# Patient Record
Sex: Male | Born: 1965 | Race: Black or African American | Hispanic: No | Marital: Single | Smoking: Never smoker
Health system: Southern US, Community
[De-identification: ages and names within clinical notes are randomized; demographics above are authoritative.]

## PROBLEM LIST (undated history)

## (undated) DIAGNOSIS — I2510 Atherosclerotic heart disease of native coronary artery without angina pectoris: Secondary | ICD-10-CM

## (undated) DIAGNOSIS — B20 Human immunodeficiency virus [HIV] disease: Secondary | ICD-10-CM

## (undated) DIAGNOSIS — I059 Rheumatic mitral valve disease, unspecified: Secondary | ICD-10-CM

## (undated) HISTORY — PX: VALVE REPLACEMENT: SUR13

---

## 2015-10-21 ENCOUNTER — Encounter (HOSPITAL_COMMUNITY): Payer: Self-pay | Admitting: Emergency Medicine

## 2015-10-21 ENCOUNTER — Observation Stay (HOSPITAL_COMMUNITY): Payer: BLUE CROSS/BLUE SHIELD

## 2015-10-21 ENCOUNTER — Inpatient Hospital Stay (HOSPITAL_COMMUNITY)
Admission: EM | Admit: 2015-10-21 | Discharge: 2015-10-26 | DRG: 444 | Disposition: A | Discharge status: Home Health | Payer: BLUE CROSS/BLUE SHIELD | Attending: Internal Medicine | Admitting: Internal Medicine

## 2015-10-21 ENCOUNTER — Emergency Department (HOSPITAL_COMMUNITY): Payer: BLUE CROSS/BLUE SHIELD

## 2015-10-21 DIAGNOSIS — K8001 Calculus of gallbladder with acute cholecystitis with obstruction: Secondary | ICD-10-CM | POA: Diagnosis not present

## 2015-10-21 DIAGNOSIS — K828 Other specified diseases of gallbladder: Secondary | ICD-10-CM | POA: Diagnosis not present

## 2015-10-21 DIAGNOSIS — I440 Atrioventricular block, first degree: Secondary | ICD-10-CM | POA: Diagnosis present

## 2015-10-21 DIAGNOSIS — K838 Other specified diseases of biliary tract: Secondary | ICD-10-CM | POA: Diagnosis present

## 2015-10-21 DIAGNOSIS — R109 Unspecified abdominal pain: Secondary | ICD-10-CM | POA: Diagnosis not present

## 2015-10-21 DIAGNOSIS — Z21 Asymptomatic human immunodeficiency virus [HIV] infection status: Secondary | ICD-10-CM | POA: Diagnosis present

## 2015-10-21 DIAGNOSIS — Z952 Presence of prosthetic heart valve: Secondary | ICD-10-CM

## 2015-10-21 DIAGNOSIS — K819 Cholecystitis, unspecified: Secondary | ICD-10-CM

## 2015-10-21 DIAGNOSIS — R1011 Right upper quadrant pain: Secondary | ICD-10-CM | POA: Diagnosis not present

## 2015-10-21 DIAGNOSIS — D696 Thrombocytopenia, unspecified: Secondary | ICD-10-CM | POA: Diagnosis not present

## 2015-10-21 DIAGNOSIS — I2510 Atherosclerotic heart disease of native coronary artery without angina pectoris: Secondary | ICD-10-CM | POA: Diagnosis present

## 2015-10-21 DIAGNOSIS — Z7982 Long term (current) use of aspirin: Secondary | ICD-10-CM

## 2015-10-21 DIAGNOSIS — B20 Human immunodeficiency virus [HIV] disease: Secondary | ICD-10-CM | POA: Diagnosis present

## 2015-10-21 DIAGNOSIS — R740 Nonspecific elevation of levels of transaminase and lactic acid dehydrogenase [LDH]: Secondary | ICD-10-CM

## 2015-10-21 DIAGNOSIS — E871 Hypo-osmolality and hyponatremia: Secondary | ICD-10-CM | POA: Diagnosis present

## 2015-10-21 DIAGNOSIS — Z79899 Other long term (current) drug therapy: Secondary | ICD-10-CM

## 2015-10-21 DIAGNOSIS — Z9889 Other specified postprocedural states: Secondary | ICD-10-CM

## 2015-10-21 DIAGNOSIS — IMO0002 Reserved for concepts with insufficient information to code with codable children: Secondary | ICD-10-CM | POA: Diagnosis present

## 2015-10-21 DIAGNOSIS — D72819 Decreased white blood cell count, unspecified: Secondary | ICD-10-CM | POA: Diagnosis present

## 2015-10-21 HISTORY — DX: Atherosclerotic heart disease of native coronary artery without angina pectoris: I25.10

## 2015-10-21 HISTORY — DX: Rheumatic mitral valve disease, unspecified: I05.9

## 2015-10-21 HISTORY — DX: Human immunodeficiency virus (HIV) disease: B20

## 2015-10-21 LAB — CBC
HEMATOCRIT: 41.3 % (ref 39.0–52.0)
HEMOGLOBIN: 14.5 g/dL (ref 13.0–17.0)
MCH: 30.5 pg (ref 26.0–34.0)
MCHC: 35.1 g/dL (ref 30.0–36.0)
MCV: 86.8 fL (ref 78.0–100.0)
Platelets: 187 10*3/uL (ref 150–400)
RBC: 4.76 MIL/uL (ref 4.22–5.81)
RDW: 12.8 % (ref 11.5–15.5)
WBC: 3.8 10*3/uL — ABNORMAL LOW (ref 4.0–10.5)

## 2015-10-21 LAB — COMPREHENSIVE METABOLIC PANEL
ALBUMIN: 4.1 g/dL (ref 3.5–5.0)
ALT: 83 U/L — ABNORMAL HIGH (ref 17–63)
ANION GAP: 7 (ref 5–15)
AST: 83 U/L — AB (ref 15–41)
Alkaline Phosphatase: 114 U/L (ref 38–126)
BUN: 13 mg/dL (ref 6–20)
CHLORIDE: 103 mmol/L (ref 101–111)
CO2: 24 mmol/L (ref 22–32)
Calcium: 9.6 mg/dL (ref 8.9–10.3)
Creatinine, Ser: 0.99 mg/dL (ref 0.61–1.24)
GFR calc Af Amer: >60 mL/min (ref >60)
GFR calc non Af Amer: >60 mL/min (ref >60)
GLUCOSE: 123 mg/dL — AB (ref 65–99)
POTASSIUM: 3.8 mmol/L (ref 3.5–5.1)
SODIUM: 134 mmol/L — AB (ref 135–145)
Total Bilirubin: 0.8 mg/dL (ref 0.3–1.2)
Total Protein: 8.5 g/dL — ABNORMAL HIGH (ref 6.5–8.1)

## 2015-10-21 LAB — URINALYSIS, ROUTINE W REFLEX MICROSCOPIC
BILIRUBIN URINE: NEGATIVE
GLUCOSE, UA: NEGATIVE mg/dL
HGB URINE DIPSTICK: NEGATIVE
Ketones, ur: 15 mg/dL — AB
Leukocytes, UA: NEGATIVE
NITRITE: NEGATIVE
PH: 6.5 (ref 5.0–8.0)
Protein, ur: NEGATIVE mg/dL
SPECIFIC GRAVITY, URINE: 1.021 (ref 1.005–1.030)

## 2015-10-21 LAB — LACTIC ACID, PLASMA: Lactic Acid, Venous: 1.6 mmol/L (ref 0.5–1.9)

## 2015-10-21 LAB — PROTIME-INR
INR: 1 (ref 0.00–1.49)
Prothrombin Time: 13.4 seconds (ref 11.6–15.2)

## 2015-10-21 LAB — LIPASE, BLOOD: Lipase: 73 U/L — ABNORMAL HIGH (ref 11–51)

## 2015-10-21 LAB — TROPONIN I

## 2015-10-21 MED ORDER — ONDANSETRON HCL 4 MG/2ML IJ SOLN
4.0000 mg | Freq: Once | INTRAMUSCULAR | Status: AC
  Administered 2015-10-21: 4 mg via INTRAVENOUS
  Filled 2015-10-21: qty 2

## 2015-10-21 MED ORDER — ASPIRIN EC 81 MG PO TBEC
81.0000 mg | DELAYED_RELEASE_TABLET | Freq: Every day | ORAL | Status: DC
  Administered 2015-10-23 – 2015-10-26 (×4): 81 mg via ORAL
  Filled 2015-10-21 (×5): qty 1

## 2015-10-21 MED ORDER — EMTRICITABINE-TENOFOVIR AF 200-25 MG PO TABS
1.0000 | ORAL_TABLET | Freq: Every day | ORAL | Status: DC
  Filled 2015-10-21 (×3): qty 1

## 2015-10-21 MED ORDER — SODIUM CHLORIDE 0.9 % IV SOLN
INTRAVENOUS | Status: AC
  Administered 2015-10-21: 1000 mL via INTRAVENOUS

## 2015-10-21 MED ORDER — ONDANSETRON HCL 4 MG/2ML IJ SOLN
4.0000 mg | Freq: Four times a day (QID) | INTRAMUSCULAR | Status: DC | PRN
Start: 2015-10-21 — End: 2015-10-26
  Filled 2015-10-21: qty 2

## 2015-10-21 MED ORDER — HEPARIN SODIUM (PORCINE) 5000 UNIT/ML IJ SOLN
5000.0000 [IU] | Freq: Three times a day (TID) | INTRAMUSCULAR | Status: DC
  Administered 2015-10-22 – 2015-10-25 (×8): 5000 [IU] via SUBCUTANEOUS
  Filled 2015-10-21 (×9): qty 1

## 2015-10-21 MED ORDER — POLYETHYLENE GLYCOL 3350 17 G PO PACK: 17.0000 g | PACK | Freq: Every day | ORAL | Status: DC | PRN

## 2015-10-21 MED ORDER — SODIUM CHLORIDE 0.9 % IV BOLUS (SEPSIS)
1000.0000 mL | Freq: Once | INTRAVENOUS | Status: AC
  Administered 2015-10-21: 1000 mL via INTRAVENOUS

## 2015-10-21 MED ORDER — ONDANSETRON HCL 4 MG PO TABS: 4.0000 mg | ORAL_TABLET | Freq: Four times a day (QID) | ORAL | Status: DC | PRN

## 2015-10-21 MED ORDER — BISACODYL 5 MG PO TBEC: 5.0000 mg | DELAYED_RELEASE_TABLET | Freq: Every day | ORAL | Status: DC | PRN

## 2015-10-21 MED ORDER — HYDROMORPHONE HCL 1 MG/ML IJ SOLN
1.0000 mg | INTRAMUSCULAR | Status: DC | PRN
  Administered 2015-10-21 – 2015-10-23 (×6): 1 mg via INTRAVENOUS
  Filled 2015-10-21 (×6): qty 1

## 2015-10-21 MED ORDER — MORPHINE SULFATE (PF) 4 MG/ML IV SOLN
4.0000 mg | Freq: Once | INTRAVENOUS | Status: AC
Start: 2015-10-21 — End: 2015-10-21
  Administered 2015-10-21: 4 mg via INTRAVENOUS
  Filled 2015-10-21: qty 1

## 2015-10-21 MED ORDER — MORPHINE SULFATE (PF) 4 MG/ML IV SOLN
4.0000 mg | Freq: Once | INTRAVENOUS | Status: AC
  Administered 2015-10-21: 4 mg via INTRAVENOUS
  Filled 2015-10-21: qty 1

## 2015-10-21 MED ORDER — HYDROCODONE-ACETAMINOPHEN 5-325 MG PO TABS
1.0000 | ORAL_TABLET | ORAL | Status: DC | PRN
  Administered 2015-10-23: 2 via ORAL
  Filled 2015-10-21: qty 2

## 2015-10-21 MED ORDER — ACETAMINOPHEN 325 MG PO TABS
650.0000 mg | ORAL_TABLET | Freq: Four times a day (QID) | ORAL | Status: DC | PRN
  Administered 2015-10-22: 650 mg via ORAL
  Filled 2015-10-21 (×2): qty 2

## 2015-10-21 MED ORDER — DARUNAVIR-COBICISTAT 800-150 MG PO TABS
1.0000 | ORAL_TABLET | Freq: Every day | ORAL | Status: DC
  Filled 2015-10-21 (×3): qty 1

## 2015-10-21 MED ORDER — ACETAMINOPHEN 650 MG RE SUPP: 650.0000 mg | Freq: Four times a day (QID) | RECTAL | Status: DC | PRN

## 2015-10-21 NOTE — ED Provider Notes (Signed)
CSN: [id number]     Arrival date & time 10/21/15  1221 History   First MD Initiated Contact with Patient 10/21/15 1542     Chief Complaint  Patient presents with  . Flank Pain  . Back Pain  . Abdominal Pain     (Consider location/radiation/quality/duration/timing/severity/associated sxs/prior Treatment) HPI Nicholas Martin is a 50 y.o. male history of HIV, here for evaluation of sudden onset right upper abdominal pain. Patient reports symptoms have been ongoing for the past one day. He reports severe tenderness to his abdomen with palpation. Discomfort radiates through to his back. He reports associated nausea and vomiting. Denies any fevers or chills, urinary symptoms, diarrhea or constipation. Nothing seems to make his problem better. No other modifying factors.  Past Medical History  Diagnosis Date  . HIV (human immunodeficiency virus infection) (HCC)   . Coronary artery disease   . Mitral valve disease    Past Surgical History  Procedure Laterality Date  . Valve replacement     Family History  Problem Relation Age of Onset  . Family history unknown: Yes   Social History  Substance Use Topics  . Smoking status: Never Smoker   . Smokeless tobacco: None  . Alcohol Use: None    Review of Systems A 10 point review of systems was completed and was negative except for pertinent positives and negatives as mentioned in the history of present illness     Allergies  Review of patient's allergies indicates no known allergies.  Home Medications   Prior to Admission medications   Medication Sig Start Date End Date Taking? Authorizing Provider  aspirin EC 81 MG tablet Take 81 mg by mouth daily.   Yes Historical Provider, MD  PREZCOBIX 800-150 MG tablet Take 1 tablet by mouth daily. 10/01/15  Yes Historical Provider, MD  TRUVADA 200-300 MG tablet Take 1 tablet by mouth daily. 10/01/15  Yes Historical Provider, MD   BP 146/75 mmHg  Pulse 61  Temp(Src) 97.2 F (36.2 C) (Oral)   Resp 18  Ht  (1.651 m)  Wt 68.04 kg  BMI 24.96 kg/m2  SpO2 100% Physical Exam  Constitutional: He is oriented to person, place, and time. He appears well-developed and well-nourished.  HENT:  Head: Normocephalic and atraumatic.  Mouth/Throat: Oropharynx is clear and moist.  Eyes: Conjunctivae are normal. Pupils are equal, round, and reactive to light. Right eye exhibits no discharge. Left eye exhibits no discharge. No scleral icterus.  Neck: Neck supple.  Cardiovascular: Normal rate, regular rhythm and normal heart sounds.   Pulmonary/Chest: Effort normal and breath sounds normal. No respiratory distress. He has no wheezes. He has no rales.  Abdominal: Soft.  Patient guards throughout abdominal exam. Tenderness right upper quadrant.  Musculoskeletal: He exhibits no tenderness.  Neurological: He is alert and oriented to person, place, and time.  Cranial Nerves II-XII grossly intact  Skin: Skin is warm and dry. No rash noted.  Psychiatric: He has a normal mood and affect.  Nursing note and vitals reviewed.   ED Course  Procedures (including critical care time) Labs Review Labs Reviewed  LIPASE, BLOOD - Abnormal; Notable for the following:    Lipase 73 (*)    All other components within normal limits  COMPREHENSIVE METABOLIC PANEL - Abnormal; Notable for the following:    Sodium 134 (*)    Glucose, Bld 123 (*)    Total Protein 8.5 (*)    AST 83 (*)    ALT 83 (*)  All other components within normal limits  CBC - Abnormal; Notable for the following:    WBC 3.8 (*)    All other components within normal limits  URINALYSIS, ROUTINE W REFLEX MICROSCOPIC (NOT AT [facility]) - Abnormal; Notable for the following:    Ketones, ur 15 (*)    All other components within normal limits  PROTIME-INR  COMPREHENSIVE METABOLIC PANEL  CBC  LACTIC ACID, PLASMA  LACTIC ACID, PLASMA  T-HELPER CELLS (CD4) COUNT (NOT AT [facility])  HIV 1 RNA QUANT-NO REFLEX-BLD    Imaging Review [place] Abdomen  Limited  10/21/2015  CLINICAL DATA:  Right upper quadrant pain for 1 day EXAM: US ABDOMEN LIMITED - RIGHT UPPER QUADRANT COMPARISON:  None. FINDINGS: Gallbladder: The gallbladder is mildly distended without wall thickening. There is tumefactive sludge within the gallbladder. There are no shadowing echogenic foci which move as is expected with gallstones. No pericholecystic fluid is evident. No sonographic Murphy sign noted by sonographer. Common bile duct: Diameter: 9 mm, mildly dilated. No mass or calculus is evident in the biliary ductal system. Liver: No focal lesion identified. Within normal limits in parenchymal echogenicity. IMPRESSION: Gallbladder is distended with tumefactive sludge within the gallbladder. Gallbladder otherwise appears unremarkable. There is prominence of the common bile duct at 9 mm without demonstrable mass or calculus in the biliary ductal system appreciable by ultrasound. Given these findings, it may be reasonable to correlate with nuclear medicine hepatobiliary imaging study to assess for cystic duct patency. Electronically Signed   By: Bretta BangWilliam  Woodruff III M.D.   On: 10/21/2015 16:48   I have personally reviewed and evaluated these images and lab results as part of my medical decision-making.   EKG Interpretation None     ED ECG REPORT   Date: 10/21/2015  Rate: 73  Rhythm: normal sinus rhythm  QRS Axis: right  Intervals: PR prolonged  ST/T Wave abnormalities: normal  Conduction Disutrbances:first-degree A-V block   Narrative Interpretation:   Old EKG Reviewed: none available  I have personally reviewed the EKG tracing and agree with the computerized printout as noted.  MDM  Patient with history of HIV, followed by [place]. in [place], here for evaluation of right upper quadrant pain 1 day. Patient has tenderness in right upper quadrant on exam. He has associated nausea and vomiting. On arrival he is hemodynamically stable and afebrile. Screening labs significant  for elevation of AST and ALT at 83, lipase 73. Ultrasound shows gallbladder is distended with tumefactive sludge.  Discussed with hospitalist, Dr. Antionette Charpyd. Patient to be admitted for possible HIDA scan in the morning. Final diagnoses:  RUQ pain        Joycie PeekBenjamin Satonya Lux, PA-C 10/21/15 1914   Loren Raceravid Yelverton, MD 11/06/15 2312

## 2015-10-21 NOTE — H&P (Signed)
History and Physical    Nicholas Martin ZOX:[id number] DOB: March 26, 1966 DOA: 10/21/2015  PCP: No primary care provider on file.   Patient coming from: Home   Chief Complaint: RUQ abdominal pain   HPI: Nicholas Martin is a 50 y.o. male with medical history significant for HIV, CAD, and mitral valve repair who presents to the emergency department for evaluation of severe right upper quadrant pain that began yesterday. Patient is here from [place] and we are in the process of obtaining medical records. History is difficult to obtain as the patient is writhing in pain, stating that he is unable to talk due to the pain, asked for a pen and paper, but does not write anything legibly. He is, however, able to provide some yes or no answers. He was apparently in his usual state until the development of acute right upper quadrant pain sometime yesterday that he reports is worse with oral intake. He denies experiencing similar symptoms previously, reports chills, but denies subjective fevers. He denies dysuria, nausea, vomiting, or diarrhea. He denies chest pain, palpitations, dyspnea, or cough. Aside from the abdominal pain, he reports no other complaints.   ED Course: Upon arrival to the ED, patient is found to be afebrile, saturating well on room air, and with vital signs stable. EKG demonstrates a sinus rhythm with first-degree AV block, right axis deviation, and LVH by voltage criteria. Chemistry panel is notable for mild hyponatremia and mild elevations in the transaminases. CBC features a leukopenia with WBC 3800 and other indices normal. Lipase is mildly elevated to 73 and a urinalysis is notable for 15 ketones only. Ultrasound of the abdomen was obtained and notable for gallbladder distention with active sludge but otherwise normal appearance to the gallbladder. Also noted on abdominal ultrasound is dilatation of the common bile duct 9 mm without mass or obstructing calculus identified on the study. Patient  was given a 1 L normal saline bolus in the emergency department and provided symptomatic care with multiple doses of IV Zofran and morphine. Patient remained hemodynamically stable and in no respiratory distress, but continued to experience intractable abdominal pain and will be observed on the medical surgical unit for ongoing evaluation and management of this, with plans for HIDA scan and additional laboratory evaluation while attempting to bring pain under better control.  Review of Systems:  All other systems reviewed and apart from HPI, are negative.  Past Medical History  Diagnosis Date  . HIV (human immunodeficiency virus infection) (HCC)   . Coronary artery disease   . Mitral valve disease     Past Surgical History  Procedure Laterality Date  . Valve replacement       reports that he has never smoked. He does not have any smokeless tobacco history on file. His alcohol and drug histories are not on file.  No Known Allergies  Family History  Problem Relation Age of Onset  . Family history unknown: Yes     Prior to Admission medications   Medication Sig Start Date End Date Taking? Authorizing Provider  aspirin EC 81 MG tablet Take 81 mg by mouth daily.   Yes Historical Provider, MD  PREZCOBIX 800-150 MG tablet Take 1 tablet by mouth daily. 10/01/15  Yes Historical Provider, MD  TRUVADA 200-300 MG tablet Take 1 tablet by mouth daily. 10/01/15  Yes Historical Provider, MD    Physical Exam: Filed Vitals:   10/21/15 1545 10/21/15 1546 10/21/15 1600 10/21/15 1615  BP: 138/80 138/80 144/79 146/75  Pulse:  58 63 61  Temp:  97.2 F (36.2 C)    TempSrc:  Oral    Resp:      Height:   (1.651 m)    Weight:      SpO2:  100% 100% 100%      Constitutional: NO apparent respiratory distress, In apparent discomfort, writhing in pain Eyes: PERTLA, lids and conjunctivae normal ENMT: Mucous membranes are moist. Posterior pharynx clear of any exudate or lesions.   Neck: normal,  supple, no masses, no thyromegaly Respiratory: clear to auscultation bilaterally, no wheezing, no crackles. Normal respiratory effort.   Cardiovascular: S1 & S2 heard, regular rate and rhythm, no significant murmurs. Trace edema to b/l LE's. 2+ pedal pulses.   Abdomen: No distension, exquisitely tender in RUQ, no rebound pain or guarding. Bowel sounds active.  Musculoskeletal: no clubbing / cyanosis. No joint deformity upper and lower extremities. Normal muscle tone.  Skin: no significant rashes, lesions, ulcers. Warm, dry, well-perfused. Neurologic: CN 2-12 grossly intact. Sensation intact, DTR normal. Strength 5/5 in all 4 limbs.  Psychiatric: Difficult to assess d/t intractable pain; behavior somewhat bizarre, claiming can't speak d/t pain, asking to write his answers to questioning, but then goes on to verbally explain what he was trying to write.     Labs on Admission: I have personally reviewed following labs and imaging studies  CBC:  Recent Labs Lab 10/21/15 1241  WBC 3.8*  HGB 14.5  HCT 41.3  MCV 86.8  PLT 187   Basic Metabolic Panel:  Recent Labs Lab 10/21/15 1241  NA 134*  K 3.8  CL 103  CO2 24  GLUCOSE 123*  BUN 13  CREATININE 0.99  CALCIUM 9.6   GFR: Estimated Creatinine Clearance: 79.4 mL/min (by C-G formula based on Cr of 0.99). Liver Function Tests:  Recent Labs Lab 10/21/15 1241  AST 83*  ALT 83*  ALKPHOS 114  BILITOT 0.8  PROT 8.5*  ALBUMIN 4.1    Recent Labs Lab 10/21/15 1241  LIPASE 73*   No results for input(s): AMMONIA in the last 168 hours. Coagulation Profile: No results for input(s): INR, PROTIME in the last 168 hours. Cardiac Enzymes: No results for input(s): CKTOTAL, CKMB, CKMBINDEX, TROPONINI in the last 168 hours. BNP (last 3 results) No results for input(s): PROBNP in the last 8760 hours. HbA1C: No results for input(s): HGBA1C in the last 72 hours. CBG: No results for input(s): GLUCAP in the last 168 hours. Lipid  Profile: No results for input(s): CHOL, HDL, LDLCALC, TRIG, CHOLHDL, LDLDIRECT in the last 72 hours. Thyroid Function Tests: No results for input(s): TSH, T4TOTAL, FREET4, T3FREE, THYROIDAB in the last 72 hours. Anemia Panel: No results for input(s): VITAMINB12, FOLATE, FERRITIN, TIBC, IRON, RETICCTPCT in the last 72 hours. Urine analysis:    Component Value Date/Time   COLORURINE YELLOW 10/21/2015 1754   APPEARANCEUR CLEAR 10/21/2015 1754   LABSPEC 1.021 10/21/2015 1754   PHURINE 6.5 10/21/2015 1754   GLUCOSEU NEGATIVE 10/21/2015 1754   HGBUR NEGATIVE 10/21/2015 1754   BILIRUBINUR NEGATIVE 10/21/2015 1754   KETONESUR 15* 10/21/2015 1754   PROTEINUR NEGATIVE 10/21/2015 1754   NITRITE NEGATIVE 10/21/2015 1754   LEUKOCYTESUR NEGATIVE 10/21/2015 1754   Sepsis Labs: (procalcitonin:4,lacticidven:4) )No results found for this or any previous visit (from the past 240 hour(s)).   Radiological Exams on Admission: US Abdomen Limited  10/21/2015  CLINICAL DATA:  Right upper quadrant pain for 1 day EXAM: US ABDOMEN LIMITED - RIGHT UPPER QUADRANT COMPARISON:  None. FINDINGS: Gallbladder: The gallbladder is mildly distended without wall thickening. There is tumefactive sludge within the gallbladder. There are no shadowing echogenic foci which move as is expected with gallstones. No pericholecystic fluid is evident. No sonographic Murphy sign noted by sonographer. Common bile duct: Diameter: 9 mm, mildly dilated. No mass or calculus is evident in the biliary ductal system. Liver: No focal lesion identified. Within normal limits in parenchymal echogenicity. IMPRESSION: Gallbladder is distended with tumefactive sludge within the gallbladder. Gallbladder otherwise appears unremarkable. There is prominence of the common bile duct at 9 mm without demonstrable mass or calculus in the biliary ductal system appreciable by ultrasound. Given these findings, it may be reasonable to correlate with nuclear  medicine hepatobiliary imaging study to assess for cystic duct patency. Electronically Signed   By: Bretta BangWilliam  Woodruff III M.D.   On: 10/21/2015 16:48    EKG: Independently reviewed. Sinus, first degree AV block, RAD, LVH by voltage criteria   Assessment/Plan  1. 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 rather than infection, will culture if febrile  - With reported hx of CAD, will check a troponin; RLL PNA unlikely, will check CXR  - Plan for further eval with HIDA scan  - Suspect there is an element of embellishment as abd exam is somewhat inconsistent and pt seems to start moaning and writhing when aware of observer; this is not to say that there is no actual acute pathology, which I believe there is  - Given degree of apparent pain, considered further imaging with CT abd/pel, but stable vitals and relatively benign labs argue against intrabdominal catastrophe; plan to check lactate, and if high, obtain CT abd, otherwise will provide supportive care and await HIDA findings  2. Hyponatremia  - Mild, serum sodium 134 on arrival  - Anticipate resolution with IVF  - Repeat CMP in am    3. 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 HIV  - Attempting to obtain records from [place]  - Managed at home with Prezcobix and Truvada  - Will continue Prezcobix here, and replace Truvada with formulary-equivalent Descovy while here  - There is some, albeit low, concern for opportunistic infection or cancer leading to the presentation, will check  CD4    DVT prophylaxis: sq heparin Code Status: Full  Family Communication: Discussed with patient Disposition Plan: Observe on med-surg  Consults called: None Admission status: Observation    Nicholas Deutscherimothy S Coty Larsh, MD [facility] Pager [phone]  If 7PM-7AM, please contact night-coverage [email or web address] Password [email or web address]  10/21/2015, 7:01 PM

## 2015-10-21 NOTE — ED Notes (Signed)
Pt signed release or information for previous medical information.

## 2015-10-21 NOTE — ED Notes (Signed)
Rt flank pain and abd pain since  Yesterday , no n/v/d  Felt sick  Denies dysuria  Hurts to touch it hurts to lay down

## 2015-10-21 NOTE — ED Notes (Signed)
Patient transported to X-ray 

## 2015-10-22 ENCOUNTER — Observation Stay (HOSPITAL_COMMUNITY): Payer: BLUE CROSS/BLUE SHIELD

## 2015-10-22 ENCOUNTER — Encounter (HOSPITAL_COMMUNITY): Payer: Self-pay | Admitting: Radiology

## 2015-10-22 DIAGNOSIS — K838 Other specified diseases of biliary tract: Secondary | ICD-10-CM | POA: Diagnosis not present

## 2015-10-22 DIAGNOSIS — R1013 Epigastric pain: Secondary | ICD-10-CM

## 2015-10-22 DIAGNOSIS — R109 Unspecified abdominal pain: Secondary | ICD-10-CM | POA: Insufficient documentation

## 2015-10-22 DIAGNOSIS — K819 Cholecystitis, unspecified: Secondary | ICD-10-CM | POA: Diagnosis not present

## 2015-10-22 DIAGNOSIS — K828 Other specified diseases of gallbladder: Secondary | ICD-10-CM | POA: Diagnosis not present

## 2015-10-22 LAB — CBC
HCT: 41.4 % (ref 39.0–52.0)
Hemoglobin: 14.1 g/dL (ref 13.0–17.0)
MCH: 29.9 pg (ref 26.0–34.0)
MCHC: 34.1 g/dL (ref 30.0–36.0)
MCV: 87.7 fL (ref 78.0–100.0)
PLATELETS: 153 10*3/uL (ref 150–400)
RBC: 4.72 MIL/uL (ref 4.22–5.81)
RDW: 12.8 % (ref 11.5–15.5)
WBC: 8.6 10*3/uL (ref 4.0–10.5)

## 2015-10-22 LAB — COMPREHENSIVE METABOLIC PANEL
ALBUMIN: 3.4 g/dL — AB (ref 3.5–5.0)
ALT: 61 U/L (ref 17–63)
ANION GAP: 8 (ref 5–15)
AST: 52 U/L — ABNORMAL HIGH (ref 15–41)
Alkaline Phosphatase: 90 U/L (ref 38–126)
BUN: 10 mg/dL (ref 6–20)
CALCIUM: 8.6 mg/dL — AB (ref 8.9–10.3)
CO2: 26 mmol/L (ref 22–32)
CREATININE: 1.06 mg/dL (ref 0.61–1.24)
Chloride: 100 mmol/L — ABNORMAL LOW (ref 101–111)
GFR calc Af Amer: >60 mL/min (ref >60)
GFR calc non Af Amer: >60 mL/min (ref >60)
Glucose, Bld: 103 mg/dL — ABNORMAL HIGH (ref 65–99)
Potassium: 4.2 mmol/L (ref 3.5–5.1)
SODIUM: 134 mmol/L — AB (ref 135–145)
TOTAL PROTEIN: 7.2 g/dL (ref 6.5–8.1)
Total Bilirubin: 1.3 mg/dL — ABNORMAL HIGH (ref 0.3–1.2)

## 2015-10-22 LAB — T-HELPER CELLS (CD4) COUNT (NOT AT ARMC)
CD4 T CELL ABS: 70 /uL — AB (ref 400–2700)
CD4 T CELL HELPER: 8 % — AB (ref 33–55)

## 2015-10-22 LAB — HIV-1 RNA QUANT-NO REFLEX-BLD
HIV 1 RNA Quant: 130 copies/mL
LOG10 HIV-1 RNA: 2.114 {Log_copies}/mL

## 2015-10-22 LAB — RAPID URINE DRUG SCREEN, HOSP PERFORMED
Amphetamines: NOT DETECTED
BARBITURATES: NOT DETECTED
Benzodiazepines: NOT DETECTED
COCAINE: NOT DETECTED
Opiates: POSITIVE — AB
TETRAHYDROCANNABINOL: NOT DETECTED

## 2015-10-22 LAB — LACTIC ACID, PLASMA: LACTIC ACID, VENOUS: 1.2 mmol/L (ref 0.5–1.9)

## 2015-10-22 LAB — GLUCOSE, CAPILLARY: Glucose-Capillary: 100 mg/dL — ABNORMAL HIGH (ref 65–99)

## 2015-10-22 LAB — LIPASE, BLOOD: Lipase: 36 U/L (ref 11–51)

## 2015-10-22 MED ORDER — PIPERACILLIN-TAZOBACTAM 3.375 G IVPB
3.3750 g | Freq: Three times a day (TID) | INTRAVENOUS | Status: DC
  Administered 2015-10-22 – 2015-10-26 (×11): 3.375 g via INTRAVENOUS
  Filled 2015-10-22 (×14): qty 50

## 2015-10-22 MED ORDER — DIATRIZOATE MEGLUMINE & SODIUM 66-10 % PO SOLN
15.0000 mL | ORAL | Status: AC
  Administered 2015-10-22 (×2): 15 mL via ORAL
  Filled 2015-10-22: qty 30

## 2015-10-22 MED ORDER — SODIUM CHLORIDE 0.9 % IV SOLN
INTRAVENOUS | Status: DC
  Administered 2015-10-23: 1000 mL via INTRAVENOUS
  Administered 2015-10-23 – 2015-10-24 (×2): via INTRAVENOUS

## 2015-10-22 MED ORDER — DIATRIZOATE MEGLUMINE & SODIUM 66-10 % PO SOLN
15.0000 mL | ORAL | Status: AC
  Filled 2015-10-22: qty 30

## 2015-10-22 MED ORDER — IOPAMIDOL (ISOVUE-300) INJECTION 61%
INTRAVENOUS | Status: AC
  Administered 2015-10-22: 100 mL
  Filled 2015-10-22: qty 100

## 2015-10-22 MED ORDER — TECHNETIUM TC 99M MEBROFENIN IV KIT
5.2000 | PACK | Freq: Once | INTRAVENOUS | Status: AC | PRN
  Administered 2015-10-22: 5 via INTRAVENOUS

## 2015-10-22 NOTE — Progress Notes (Signed)
Pharmacy Antibiotic Note  Nicholas LinKevin L Martin is a 50 y.o. male admitted on 10/21/2015 with intra-abdominal infection.  Pharmacy has been consulted for zosyn dosing.  Plan: Zosyn 3.375g IV q8h (4 hour infusion).  Monitor fever, WBC, signs of infection  Monitor renal function  Height: 5\' 9"  (175.3 cm) Weight: 150 lb (68.04 kg) IBW/kg (Calculated) : 70.7  Temp (24hrs), Avg:98.5 F (36.9 C), Min:97.2 F (36.2 C), Max:99.8 F (37.7 C)   Recent Labs Lab 10/21/15 1241 10/21/15 2000 10/22/15 0536  WBC 3.8*  --  8.6  CREATININE 0.99  --  1.06  LATICACIDVEN  --  1.6 1.2    Estimated Creatinine Clearance: 82 mL/min (by C-G formula based on Cr of 1.06).    No Known Allergies  Antimicrobials this admission: Zosyn 7/21 >>  Dose adjustments this admission: N/A  Microbiology results: None  Thank you for allowing pharmacy to be a part of this patient's care.  Gwyndolyn KaufmanKai Kang Bernette Redbird(Kenny), PharmD  PGY1 Pharmacy Resident Pager: [phone] 10/22/2015 10:58 AM   I discussed / reviewed the pharmacy note by Dr. Pandora LeiterKang and I agree with the resident's findings and plans as documented. Thank you Okey RegalLisa Alazia Crocket, PharmD [phone]

## 2015-10-22 NOTE — Consult Note (Addendum)
Reason for Consult: RUQ abdominal pain  Referring Physician: Dr. Christia Reading Opyd   Nicholas Martin is an 50 y.o. male with a history of methamphetamine substance misuse, HIV and mitral valve repair. Patient started having constant, 10/10 stabbing and aching RUQ pain since 2:00 am on October 20, 2015. Patient had never experienced the aforementioned symptoms in the past. RUQ pain radiated to his back and right shoulder and was intensified with movement. Patient was unable to drink or eat due nausea and vomiting associated with the pain. Patient experienced chills but unsure about fever. He is currently residing in [place] [employer] for substance misuse treatment at [facility]. Patient's last bowel movement was yesterday at 6:00 am. Passing flatus.Patient is currently anticipating Hida scan.    Past Medical History  Diagnosis Date  . HIV (human immunodeficiency virus infection) ([place])   . Coronary artery disease   . Mitral valve disease     Past Surgical History  Procedure Laterality Date  . Valve replacement      Family History  Problem Relation Age of Onset  . Family history unknown: Yes    Social History:  Denies smoking. Denies alcohol. Originally from [place], [place]. In treatment for methamphetamine misuse.   Allergies: No Known Allergies  Medications:  Prior to Admission medications   Medication Sig Start Date End Date Taking? Authorizing Provider  aspirin EC 81 MG tablet Take 81 mg by mouth daily.   Yes Historical Provider, MD  PREZCOBIX 800-150 MG tablet Take 1 tablet by mouth daily. 10/01/15  Yes Historical Provider, MD  TRUVADA 200-300 MG tablet Take 1 tablet by mouth daily. 10/01/15  Yes Historical Provider, MD     Results for orders placed or performed during the hospital encounter of 10/21/15 (from the past 48 hour(s))  Lipase, blood     Status: Abnormal   Collection Time: 10/21/15 12:41 PM  Result Value Ref Range   Lipase 73 (H) 11 - 51 U/L  Comprehensive metabolic panel      Status: Abnormal   Collection Time: 10/21/15 12:41 PM  Result Value Ref Range   Sodium 134 (L) 135 - 145 mmol/L   Potassium 3.8 3.5 - 5.1 mmol/L   Chloride 103 101 - 111 mmol/L   CO2 24 22 - 32 mmol/L   Glucose, Bld 123 (H) 65 - 99 mg/dL   BUN 13 6 - 20 mg/dL   Creatinine, Ser 0.99 0.61 - 1.24 mg/dL   Calcium 9.6 8.9 - 10.3 mg/dL   Total Protein 8.5 (H) 6.5 - 8.1 g/dL   Albumin 4.1 3.5 - 5.0 g/dL   AST 83 (H) 15 - 41 U/L   ALT 83 (H) 17 - 63 U/L   Alkaline Phosphatase 114 38 - 126 U/L   Total Bilirubin 0.8 0.3 - 1.2 mg/dL   GFR calc non Af Amer >60 >60 mL/min   GFR calc Af Amer >60 >60 mL/min    Comment: (NOTE) The eGFR has been calculated using the CKD EPI equation. This calculation has not been validated in all clinical situations. eGFR's persistently <60 mL/min signify possible Chronic Kidney Disease.    Anion gap 7 5 - 15  CBC     Status: Abnormal   Collection Time: 10/21/15 12:41 PM  Result Value Ref Range   WBC 3.8 (L) 4.0 - 10.5 K/uL   RBC 4.76 4.22 - 5.81 MIL/uL   Hemoglobin 14.5 13.0 - 17.0 g/dL   HCT 41.3 39.0 - 52.0 %   MCV 86.8 78.0 -  100.0 fL   MCH 30.5 26.0 - 34.0 pg   MCHC 35.1 30.0 - 36.0 g/dL   RDW 12.8 11.5 - 15.5 %   Platelets 187 150 - 400 K/uL  Urinalysis, Routine w reflex microscopic     Status: Abnormal   Collection Time: 10/21/15  5:54 PM  Result Value Ref Range   Color, Urine YELLOW YELLOW   APPearance CLEAR CLEAR   Specific Gravity, Urine 1.021 1.005 - 1.030   pH 6.5 5.0 - 8.0   Glucose, UA NEGATIVE NEGATIVE mg/dL   Hgb urine dipstick NEGATIVE NEGATIVE   Bilirubin Urine NEGATIVE NEGATIVE   Ketones, ur 15 (A) NEGATIVE mg/dL   Protein, ur NEGATIVE NEGATIVE mg/dL   Nitrite NEGATIVE NEGATIVE   Leukocytes, UA NEGATIVE NEGATIVE    Comment: MICROSCOPIC NOT DONE ON URINES WITH NEGATIVE PROTEIN, BLOOD, LEUKOCYTES, NITRITE, OR GLUCOSE <1000 mg/dL.  Protime-INR     Status: None   Collection Time: 10/21/15  7:39 PM  Result Value Ref Range    Prothrombin Time 13.4 11.6 - 15.2 seconds   INR 1.00 0.00 - 1.49  T-helper cells (CD4) count (not at [facility])     Status: Abnormal   Collection Time: 10/21/15  7:39 PM  Result Value Ref Range   CD4 T Cell Abs 70 (L) 400 - 2700 /uL   CD4 % Helper T Cell 8 (L) 33 - 55 %    Comment: Performed at [facility]  Troponin I     Status: None   Collection Time: 10/21/15  7:39 PM  Result Value Ref Range   Troponin I <0.03 <0.03 ng/mL  Lactic acid, plasma     Status: None   Collection Time: 10/21/15  8:00 PM  Result Value Ref Range   Lactic Acid, Venous 1.6 0.5 - 1.9 mmol/L  Comprehensive metabolic panel     Status: Abnormal   Collection Time: 10/22/15  5:36 AM  Result Value Ref Range   Sodium 134 (L) 135 - 145 mmol/L   Potassium 4.2 3.5 - 5.1 mmol/L   Chloride 100 (L) 101 - 111 mmol/L   CO2 26 22 - 32 mmol/L   Glucose, Bld 103 (H) 65 - 99 mg/dL   BUN 10 6 - 20 mg/dL   Creatinine, Ser 1.06 0.61 - 1.24 mg/dL   Calcium 8.6 (L) 8.9 - 10.3 mg/dL   Total Protein 7.2 6.5 - 8.1 g/dL   Albumin 3.4 (L) 3.5 - 5.0 g/dL   AST 52 (H) 15 - 41 U/L   ALT 61 17 - 63 U/L   Alkaline Phosphatase 90 38 - 126 U/L   Total Bilirubin 1.3 (H) 0.3 - 1.2 mg/dL   GFR calc non Af Amer >60 >60 mL/min   GFR calc Af Amer >60 >60 mL/min    Comment: (NOTE) The eGFR has been calculated using the CKD EPI equation. This calculation has not been validated in all clinical situations. eGFR's persistently <60 mL/min signify possible Chronic Kidney Disease.    Anion gap 8 5 - 15  CBC     Status: None   Collection Time: 10/22/15  5:36 AM  Result Value Ref Range   WBC 8.6 4.0 - 10.5 K/uL   RBC 4.72 4.22 - 5.81 MIL/uL   Hemoglobin 14.1 13.0 - 17.0 g/dL   HCT 41.4 39.0 - 52.0 %   MCV 87.7 78.0 - 100.0 fL   MCH 29.9 26.0 - 34.0 pg   MCHC 34.1 30.0 - 36.0 g/dL  RDW 12.8 11.5 - 15.5 %   Platelets 153 150 - 400 K/uL  Lactic acid, plasma     Status: None   Collection Time: 10/22/15  5:36 AM  Result Value  Ref Range   Lactic Acid, Venous 1.2 0.5 - 1.9 mmol/L  Glucose, capillary     Status: Abnormal   Collection Time: 10/22/15  7:48 AM  Result Value Ref Range   Glucose-Capillary 100 (H) 65 - 99 mg/dL  Lipase, blood     Status: None   Collection Time: 10/22/15  9:06 AM  Result Value Ref Range   Lipase 36 11 - 51 U/L    Dg Chest 1 View  10/21/2015  CLINICAL DATA:  Right upper quadrant and flank pain 2 days with nausea and vomiting today. EXAM: CHEST 1 VIEW COMPARISON:  None. FINDINGS: Sternotomy wires are present. Lungs are adequately inflated with no lobar consolidation or effusion. Rounded nodular densities over the lung bases which may be parenchymal nodules although cannot exclude nipple densities. Cardiomediastinal silhouette is within normal. There are mild degenerate changes of the spine. IMPRESSION: No acute cardiopulmonary disease. 2 small round nodular densities over the lung bases which may be parenchymal nodules versus overlying nipples. Recommend follow-up PA chest radiograph with nipple markers. Electronically Signed   By: Marin Olp M.D.   On: 10/21/2015 20:26   US Abdomen Limited  10/21/2015  CLINICAL DATA:  Right upper quadrant pain for 1 day EXAM: US ABDOMEN LIMITED - RIGHT UPPER QUADRANT COMPARISON:  None. FINDINGS: Gallbladder: The gallbladder is mildly distended without wall thickening. There is tumefactive sludge within the gallbladder. There are no shadowing echogenic foci which move as is expected with gallstones. No pericholecystic fluid is evident. No sonographic Murphy sign noted by sonographer. Common bile duct: Diameter: 9 mm, mildly dilated. No mass or calculus is evident in the biliary ductal system. Liver: No focal lesion identified. Within normal limits in parenchymal echogenicity. IMPRESSION: Gallbladder is distended with tumefactive sludge within the gallbladder. Gallbladder otherwise appears unremarkable. There is prominence of the common bile duct at 9 mm without  demonstrable mass or calculus in the biliary ductal system appreciable by ultrasound. Given these findings, it may be reasonable to correlate with nuclear medicine hepatobiliary imaging study to assess for cystic duct patency. Electronically Signed   By: Lowella Grip III M.D.   On: 10/21/2015 16:48   Dg Chest 2v Repeat Same Day  10/22/2015  CLINICAL DATA:  Evaluate possible lung nodules identified on yesterday's examination, now with nipple markers in place. EXAM: CHEST  2 VIEW COMPARISON:  10/21/2015. FINDINGS: The nodular opacities questioned at the lung bases on yesterday's examination are confirmed to represent the nipples. The nipples project more superiorly on today's examination due to slight lordotic technique. Prior sternotomy for aortic valve replacement. Cardiac silhouette mildly enlarged. Lungs clear. 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 No pleural effusions. Visualized bony thorax intact. IMPRESSION: 1. The nodular opacities questioned on yesterday's examination are in fact the nipples. No evidence of lung nodules. 2. Mild cardiomegaly. Pulmonary venous hypertension without overt edema. No acute cardiopulmonary disease. Electronically Signed   By: Evangeline Dakin M.D.   On: 10/22/2015 08:13    Review of Systems  Constitutional: Negative for fever and chills.  Eyes: Negative for blurred vision.  Respiratory: Negative for cough.   Cardiovascular: Negative for chest pain.  Gastrointestinal: Positive for nausea, vomiting and abdominal pain. Negative for diarrhea and constipation.  Neurological: Negative for dizziness and headaches.  Blood pressure 131/69, pulse 93, temperature 99.2 F (37.3 C), temperature source Oral, resp. rate 16, height 5' 9"  (1.753 m), weight 150 lb (68.04 kg), SpO2 97 %. Physical Exam  Constitutional: He is oriented to person, place, and time. He appears well-developed.  HENT:  Head: Normocephalic and atraumatic.  Eyes: Pupils  are equal, round, and reactive to light.  Neck: Normal range of motion.  Cardiovascular: Normal heart sounds and intact distal pulses.   Tachycardic   Respiratory: Effort normal.  Scar visualized from mitral valve repair  GI:  Positive Murphy's sign. Very tender to light palpation in the right and left upper quadrants. Distension visualized. Guarding on physical exam. +BS  Neurological: He is alert and oriented to person, place, and time.  Skin: Skin is warm and dry.  Psychiatric: Thought content normal.    Assessment/Plan: HIV - On Truvada and Prezcobix  RUQ pain - ?cholecystitis RUQ ultrasound indicated gallbladder distension and sludge but no pericholecystic fluid, sonographic murphy's sign or gallstones. The common bile duct is dilated to 9 mm. Patient's AST is mildly elevated 52. ALT is currently within reference range. Lipase is mildly elevated (73). Patient's WBC went from 3.8 on 10/21/2015 to 8.6 on 10/22/2015. Awaiting Hida results. VTE: Heparin  FEN: NPO ID: [email or web address]  Dispo: Hida, potential impending cholecystectomy   Lannie Fields PASII  10/22/2015, 11:19 AM   Addendum: he doesn't really want to talk to me right now, he complains of diffuse abdominal pain and does want me to examine him.  Im not sure what primary process is but not sure this is his gb.  I think a ct abd/pelvis is what needs to be done and we will follow up this.  His hida shows nonvisualization of his gb but Im concerned this may not be what is source of pain. He has been on pain meds. Think doing ct best plan prior to decision making. If this is his gb would recommend perc drainage for him given cd4 count as well as his current state.  I think his medical condition can be maximized prior to any surgery and his outcome better.

## 2015-10-22 NOTE — Progress Notes (Addendum)
[facility] PROGRESS NOTE  Nicholas Martin VHQ:[id number] DOB: 05-01-1965 DOA: 10/21/2015   PCP: No primary care provider on file.     Assessment/Plan: Principal Problem:   Intractable abdominal pain Active Problems:   Gallbladder sludge   HIV (human immunodeficiency virus infection) ([facility])   Leukopenia   Hyponatremia   Transaminase or LDH elevation   H/O mitral valve repair   Common bile duct dilatation   Abdominal pain   50 y.o. male with medical history significant for HIV, CAD, and mitral valve repair who presents to the emergency department for evaluation of severe right upper quadrant pain that began yesterday.Ultrasound of the abdomen was obtained and notable for gallbladder distention with active sludge but otherwise normal appearance to the gallbladder. Also noted on abdominal ultrasound is dilatation of the common bile duct 9 mm without mass or obstructing calculus identified on the study  Assessment and plan  1. 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 Hyponatremia  - Mild, serum sodium 134 on arrival  - Anticipate resolution with IVF  - Repeat CMP in am   3. 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 HIV  - Attempting to obtain records from [place]  - Managed at home with Prezcobix and Truvada  - Will continue Prezcobix here, and replace Truvada with formulary-equivalent Descovy while here  - There is some, albeit low, concern for opportunistic infection or cancer leading to the presentation, will check CD4     DVT prophylaxsis heparin  Code Status:  Full code    Family Communication: Discussed in detail with the patient, all imaging results, lab results explained to the patient   Disposition Plan:  General surgery consult      Consultants:  General surgery  Procedures:  None  Antibiotics: Anti-infectives    Start     Dose/Rate Route Frequency Ordered Stop   10/21/15 2300  darunavir-cobicistat (PREZCOBIX) 800-150 MG per tablet 1 tablet     1 tablet Oral Daily 10/21/15 1900     10/21/15 2300  emtricitabine-tenofovir AF (DESCOVY) 200-25 MG per tablet 1 tablet     1 tablet Oral Daily 10/21/15 1900           HPI/Subjective: Continues to have right upper quadrant and epigastric pain  Objective: Filed Vitals:   10/21/15 2153 10/21/15 2203 10/22/15 0530 10/22/15 0813  BP: 135/78  113/66 131/69  Pulse: 82  103 93  Temp: 99.1 F (37.3 C)  99.8 F (37.7 C) 99.2 F (37.3 C)  TempSrc: Oral  Oral Oral  Resp: 23  16 16   Height:  5\' 9"  (1.753 m)    Weight:      SpO2: 100%  98% 97%    Intake/Output Summary (Last 24 hours) at 10/22/15 1021 Last data filed at 10/22/15 0802  Gross per 24 hour  Intake      0 ml  Output    400 ml  Net   -400 ml    Exam:  Examination:  General exam: Appears calm and comfortable  Respiratory system: Clear to auscultation. Respiratory effort normal. Cardiovascular system: S1 & S2 heard, RRR. No JVD, murmurs, rubs, gallops or clicks. No pedal edema. Gastrointestinal system:  Tender to palpation in the right upper quadrant, guarding. No organomegaly or masses felt. Normal bowel sounds heard. Central nervous system: Alert and oriented. No focal neurological deficits. Extremities: Symmetric 5 x 5 power. Skin: No rashes, lesions or ulcers Psychiatry: Judgement and insight appear normal. Mood & affect appropriate.     Data Reviewed: I have personally reviewed following labs and imaging studies  Micro Results No results found for this or any previous visit (from the past 240 hour(s)).  Radiology Reports Dg Chest 1 View  10/21/2015  CLINICAL DATA:  Right upper quadrant and flank pain 2 days with nausea and vomiting today. EXAM: CHEST 1 VIEW COMPARISON:  None. FINDINGS: Sternotomy wires are present. Lungs are adequately inflated with no lobar consolidation or effusion. Rounded nodular densities over the lung bases which may be parenchymal nodules although cannot exclude nipple densities. Cardiomediastinal silhouette is within normal. There are mild degenerate changes of the spine. IMPRESSION: No acute cardiopulmonary disease. 2 small round nodular densities over the lung bases which may be parenchymal nodules versus overlying nipples. Recommend follow-up PA chest radiograph with nipple markers. Electronically Signed   By: Elberta Fortis M.D.   On: 10/21/2015 20:26   US Abdomen Limited  10/21/2015  CLINICAL DATA:  Right upper quadrant pain for 1 day EXAM: US ABDOMEN LIMITED - RIGHT UPPER QUADRANT COMPARISON:  None. FINDINGS: Gallbladder: The gallbladder is mildly distended without wall thickening. There is tumefactive sludge within the gallbladder. There are no shadowing echogenic foci which move as is expected with gallstones. No pericholecystic fluid is evident. No sonographic Murphy sign noted by sonographer. Common bile duct: Diameter: 9 mm, mildly dilated. No mass or calculus is evident in the biliary ductal system. Liver: No focal lesion identified. Within normal limits in  parenchymal echogenicity. IMPRESSION: Gallbladder is distended with tumefactive sludge within the gallbladder. Gallbladder otherwise appears unremarkable. There is prominence of the common bile duct at 9 mm without demonstrable mass or calculus in the biliary ductal system appreciable by ultrasound. Given these findings, it may be reasonable to correlate with nuclear medicine hepatobiliary imaging study to assess for cystic duct patency. Electronically Signed   By: Bretta Bang III M.D.   On: 10/21/2015 16:48   Dg Chest 2v Repeat Same Day  10/22/2015  CLINICAL DATA:  Evaluate possible lung nodules identified on yesterday's examination, now with nipple markers in place. EXAM: CHEST  2 VIEW COMPARISON:  10/21/2015. FINDINGS: The nodular opacities questioned at the lung bases on yesterday's examination are confirmed to represent the nipples. The nipples project more superiorly on today's examination due to slight lordotic technique. Prior sternotomy for aortic valve replacement. Cardiac silhouette mildly enlarged. Lungs clear. 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 No pleural effusions. Visualized bony thorax intact. IMPRESSION: 1. The nodular opacities questioned on yesterday's examination are in  fact the nipples. No evidence of lung nodules. 2. Mild cardiomegaly. Pulmonary venous hypertension without overt edema. No acute cardiopulmonary disease. Electronically Signed   By: Hulan Saashomas  Lawrence M.D.   On: 10/22/2015 08:13     CBC  Recent Labs Lab 10/21/15 1241 10/22/15 0536  WBC 3.8* 8.6  HGB 14.5 14.1  HCT 41.3 41.4  PLT 187 153  MCV 86.8 87.7  MCH 30.5 29.9  MCHC 35.1 34.1  RDW 12.8 12.8    Chemistries   Recent Labs Lab 10/21/15 1241 10/22/15 0536  NA 134* 134*  K 3.8 4.2  CL 103 100*  CO2 24 26  GLUCOSE 123* 103*  BUN 13 10  CREATININE 0.99 1.06  CALCIUM 9.6 8.6*  AST 83* 52*  ALT 83* 61  ALKPHOS 114 90  BILITOT 0.8 1.3*    ------------------------------------------------------------------------------------------------------------------ estimated creatinine clearance is 82 mL/min (by C-G formula based on Cr of 1.06). ------------------------------------------------------------------------------------------------------------------ No results for input(s): HGBA1C in the last 72 hours. ------------------------------------------------------------------------------------------------------------------ No results for input(s): CHOL, HDL, LDLCALC, TRIG, CHOLHDL, LDLDIRECT in the last 72 hours. ------------------------------------------------------------------------------------------------------------------ No results for input(s): TSH, T4TOTAL, T3FREE, THYROIDAB in the last 72 hours.  Invalid input(s): FREET3 ------------------------------------------------------------------------------------------------------------------ No results for input(s): VITAMINB12, FOLATE, FERRITIN, TIBC, IRON, RETICCTPCT in the last 72 hours.  Coagulation profile  Recent Labs Lab 10/21/15 1939  INR 1.00    No results for input(s): DDIMER in the last 72 hours.  Cardiac Enzymes  Recent Labs Lab 10/21/15 1939  TROPONINI <0.03   ------------------------------------------------------------------------------------------------------------------ Invalid input(s): POCBNP   CBG:  Recent Labs Lab 10/22/15 0748  GLUCAP 100*       Studies: Dg Chest 1 View  10/21/2015  CLINICAL DATA:  Right upper quadrant and flank pain 2 days with nausea and vomiting today. EXAM: CHEST 1 VIEW COMPARISON:  None. FINDINGS: Sternotomy wires are present. Lungs are adequately inflated with no lobar consolidation or effusion. Rounded nodular densities over the lung bases which may be parenchymal nodules although cannot exclude nipple densities. Cardiomediastinal silhouette is within normal. There are mild degenerate changes of the spine. IMPRESSION:  No acute cardiopulmonary disease. 2 small round nodular densities over the lung bases which may be parenchymal nodules versus overlying nipples. Recommend follow-up PA chest radiograph with nipple markers. Electronically Signed   By: Elberta Fortisaniel  Boyle M.D.   On: 10/21/2015 20:26   [place] Abdomen Limited  10/21/2015  CLINICAL DATA:  Right upper quadrant pain for 1 day EXAM: US ABDOMEN LIMITED - RIGHT UPPER QUADRANT COMPARISON:  None. FINDINGS: Gallbladder: The gallbladder is mildly distended without wall thickening. There is tumefactive sludge within the gallbladder. There are no shadowing echogenic foci which move as is expected with gallstones. No pericholecystic fluid is evident. No sonographic Murphy sign noted by sonographer. Common bile duct: Diameter: 9 mm, mildly dilated. No mass or calculus is evident in the biliary ductal system. Liver: No focal lesion identified. Within normal limits in parenchymal echogenicity. IMPRESSION: Gallbladder is distended with tumefactive sludge within the gallbladder. Gallbladder otherwise appears unremarkable. There is prominence of the common bile duct at 9 mm without demonstrable mass or calculus in the biliary ductal system appreciable by ultrasound. Given these findings, it may be reasonable to correlate with nuclear medicine hepatobiliary imaging study to assess for cystic duct patency. Electronically Signed   By: Bretta BangWilliam  Woodruff III M.D.   On: 10/21/2015 16:48   Dg Chest 2v Repeat Same Day  10/22/2015  CLINICAL DATA:  Evaluate possible lung nodules identified on yesterday's examination, now  with nipple markers in place. EXAM: CHEST  2 VIEW COMPARISON:  10/21/2015. FINDINGS: The nodular opacities questioned at the lung bases on yesterday's examination are confirmed to represent the nipples. The nipples project more superiorly on today's examination due to slight lordotic technique. Prior sternotomy for aortic valve replacement. Cardiac silhouette mildly enlarged. Lungs  clear. 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 No pleural effusions. Visualized bony thorax intact. IMPRESSION: 1. The nodular opacities questioned on yesterday's examination are in fact the nipples. No evidence of lung nodules. 2. Mild cardiomegaly. Pulmonary venous hypertension without overt edema. No acute cardiopulmonary disease. Electronically Signed   By: Hulan Saas M.D.   On: 10/22/2015 08:13      No results found for: HGBA1C Lab Results  Component Value Date   CREATININE 1.06 10/22/2015       Scheduled Meds: . iopamidol      . aspirin EC  81 mg Oral Daily  . darunavir-cobicistat  1 tablet Oral Daily  . diatrizoate meglumine-sodium  15 mL Oral Q1 Hr x 2  . emtricitabine-tenofovir AF  1 tablet Oral Daily  . heparin  5,000 Units Subcutaneous Q8H   Continuous Infusions:       Time spent: >30 MINS    [facility]  [facility] Pager [phone]. If 7PM-7AM, please contact night-coverage at [email or web address], password [facility] 10/22/2015, 10:21 AM

## 2015-10-22 NOTE — Progress Notes (Signed)
New Admission Note: late entry  Arrival Method: stretcher Mental Orientation: alert and oriented x 4 Telemetry:no Assessment: Completed Skin:intact ZO:XWRUV:left f a ns@100   Pain:intermittent Tubes:none Safety Measures: Safety Fall Prevention Plan has been given, discussed and signed Admission: Completed [facility] Orientation: Patient has been orientated to the room, unit and staff.  Family:none available  Orders have been reviewed and implemented. Will continue to monitor the patient. Call light has been placed within reach. Clement Sayresathie Syon Tews, RN Phone number: [phone]

## 2015-10-22 NOTE — Progress Notes (Signed)
Radiologist brought gastrografin but patient has to go to [place] Hepatobiliary Liver Func test before CT of abdomen with contrast per Dr. Susie CassetteAbrol, notified the radiology and [facility].

## 2015-10-23 ENCOUNTER — Observation Stay (HOSPITAL_COMMUNITY): Payer: BLUE CROSS/BLUE SHIELD

## 2015-10-23 ENCOUNTER — Encounter (HOSPITAL_COMMUNITY): Payer: Self-pay | Admitting: Radiology

## 2015-10-23 DIAGNOSIS — R1011 Right upper quadrant pain: Secondary | ICD-10-CM | POA: Diagnosis present

## 2015-10-23 DIAGNOSIS — R109 Unspecified abdominal pain: Secondary | ICD-10-CM | POA: Diagnosis not present

## 2015-10-23 DIAGNOSIS — D696 Thrombocytopenia, unspecified: Secondary | ICD-10-CM | POA: Diagnosis not present

## 2015-10-23 DIAGNOSIS — Z7982 Long term (current) use of aspirin: Secondary | ICD-10-CM | POA: Diagnosis not present

## 2015-10-23 DIAGNOSIS — I440 Atrioventricular block, first degree: Secondary | ICD-10-CM | POA: Diagnosis present

## 2015-10-23 DIAGNOSIS — B20 Human immunodeficiency virus [HIV] disease: Secondary | ICD-10-CM | POA: Diagnosis present

## 2015-10-23 DIAGNOSIS — E871 Hypo-osmolality and hyponatremia: Secondary | ICD-10-CM | POA: Diagnosis present

## 2015-10-23 DIAGNOSIS — R1013 Epigastric pain: Secondary | ICD-10-CM | POA: Diagnosis not present

## 2015-10-23 DIAGNOSIS — D72819 Decreased white blood cell count, unspecified: Secondary | ICD-10-CM | POA: Diagnosis present

## 2015-10-23 DIAGNOSIS — I2510 Atherosclerotic heart disease of native coronary artery without angina pectoris: Secondary | ICD-10-CM | POA: Diagnosis present

## 2015-10-23 DIAGNOSIS — Z79899 Other long term (current) drug therapy: Secondary | ICD-10-CM | POA: Diagnosis not present

## 2015-10-23 DIAGNOSIS — K838 Other specified diseases of biliary tract: Secondary | ICD-10-CM | POA: Diagnosis not present

## 2015-10-23 DIAGNOSIS — K8001 Calculus of gallbladder with acute cholecystitis with obstruction: Secondary | ICD-10-CM | POA: Diagnosis present

## 2015-10-23 DIAGNOSIS — K828 Other specified diseases of gallbladder: Secondary | ICD-10-CM | POA: Diagnosis not present

## 2015-10-23 DIAGNOSIS — K819 Cholecystitis, unspecified: Secondary | ICD-10-CM | POA: Diagnosis not present

## 2015-10-23 DIAGNOSIS — Z952 Presence of prosthetic heart valve: Secondary | ICD-10-CM | POA: Diagnosis not present

## 2015-10-23 LAB — COMPREHENSIVE METABOLIC PANEL
ALBUMIN: 2.8 g/dL — AB (ref 3.5–5.0)
ALT: 60 U/L (ref 17–63)
ANION GAP: 9 (ref 5–15)
AST: 63 U/L — ABNORMAL HIGH (ref 15–41)
Alkaline Phosphatase: 97 U/L (ref 38–126)
BILIRUBIN TOTAL: 9.9 mg/dL — AB (ref 0.3–1.2)
BUN: 11 mg/dL (ref 6–20)
CHLORIDE: 101 mmol/L (ref 101–111)
CO2: 25 mmol/L (ref 22–32)
Calcium: 8.4 mg/dL — ABNORMAL LOW (ref 8.9–10.3)
Creatinine, Ser: 1.11 mg/dL (ref 0.61–1.24)
GFR calc Af Amer: >60 mL/min (ref >60)
GFR calc non Af Amer: >60 mL/min (ref >60)
GLUCOSE: 71 mg/dL (ref 65–99)
POTASSIUM: 4.1 mmol/L (ref 3.5–5.1)
SODIUM: 135 mmol/L (ref 135–145)
TOTAL PROTEIN: 6.8 g/dL (ref 6.5–8.1)

## 2015-10-23 LAB — CBC
HEMATOCRIT: 38.5 % — AB (ref 39.0–52.0)
Hemoglobin: 13.1 g/dL (ref 13.0–17.0)
MCH: 30 pg (ref 26.0–34.0)
MCHC: 34 g/dL (ref 30.0–36.0)
MCV: 88.3 fL (ref 78.0–100.0)
PLATELETS: 143 10*3/uL — AB (ref 150–400)
RBC: 4.36 MIL/uL (ref 4.22–5.81)
RDW: 13 % (ref 11.5–15.5)
WBC: 8.2 10*3/uL (ref 4.0–10.5)

## 2015-10-23 LAB — GLUCOSE, CAPILLARY: GLUCOSE-CAPILLARY: 83 mg/dL (ref 65–99)

## 2015-10-23 MED ORDER — FENTANYL CITRATE (PF) 100 MCG/2ML IJ SOLN
INTRAMUSCULAR | Status: AC
  Filled 2015-10-23: qty 4

## 2015-10-23 MED ORDER — HYDROCODONE-ACETAMINOPHEN 5-325 MG PO TABS: 1.0000 | ORAL_TABLET | ORAL | Status: DC | PRN

## 2015-10-23 MED ORDER — FENTANYL CITRATE (PF) 100 MCG/2ML IJ SOLN
INTRAMUSCULAR | Status: AC | PRN
  Administered 2015-10-23 (×2): 25 ug via INTRAVENOUS
  Administered 2015-10-23 (×2): 50 ug via INTRAVENOUS

## 2015-10-23 MED ORDER — HYDROMORPHONE HCL 1 MG/ML IJ SOLN
INTRAMUSCULAR | Status: AC | PRN
  Administered 2015-10-23: 1 mg via INTRAVENOUS

## 2015-10-23 MED ORDER — KETOROLAC TROMETHAMINE 30 MG/ML IJ SOLN
INTRAMUSCULAR | Status: AC
  Filled 2015-10-23: qty 1

## 2015-10-23 MED ORDER — KETOROLAC TROMETHAMINE 30 MG/ML IJ SOLN
INTRAMUSCULAR | Status: AC | PRN
  Administered 2015-10-23: 30 mg via INTRAVENOUS

## 2015-10-23 MED ORDER — EMTRICITABINE-TENOFOVIR AF 200-25 MG PO TABS
1.0000 | ORAL_TABLET | Freq: Every day | ORAL | Status: DC
Start: 2015-10-23 — End: 2015-10-26
  Administered 2015-10-23 – 2015-10-25 (×3): 1 via ORAL
  Filled 2015-10-23 (×4): qty 1

## 2015-10-23 MED ORDER — DARUNAVIR-COBICISTAT 800-150 MG PO TABS
1.0000 | ORAL_TABLET | Freq: Every day | ORAL | Status: DC
  Administered 2015-10-23 – 2015-10-25 (×3): 1 via ORAL
  Filled 2015-10-23 (×3): qty 1

## 2015-10-23 MED ORDER — KETOROLAC TROMETHAMINE 30 MG/ML IJ SOLN
30.0000 mg | Freq: Four times a day (QID) | INTRAMUSCULAR | Status: AC | PRN
  Administered 2015-10-24 – 2015-10-25 (×4): 30 mg via INTRAVENOUS
  Filled 2015-10-23 (×4): qty 1

## 2015-10-23 MED ORDER — MIDAZOLAM HCL 2 MG/2ML IJ SOLN
INTRAMUSCULAR | Status: AC
  Filled 2015-10-23: qty 4

## 2015-10-23 MED ORDER — LIDOCAINE HCL 1 % IJ SOLN
INTRAMUSCULAR | Status: AC | PRN
  Administered 2015-10-23: 10 mL

## 2015-10-23 MED ORDER — LIDOCAINE HCL 1 % IJ SOLN
INTRAMUSCULAR | Status: AC
  Filled 2015-10-23: qty 20

## 2015-10-23 MED ORDER — HYDROMORPHONE HCL 1 MG/ML IJ SOLN
INTRAMUSCULAR | Status: AC
  Filled 2015-10-23: qty 1

## 2015-10-23 MED ORDER — MIDAZOLAM HCL 2 MG/2ML IJ SOLN
INTRAMUSCULAR | Status: AC | PRN
  Administered 2015-10-23: 1 mg via INTRAVENOUS
  Administered 2015-10-23: 0.5 mg via INTRAVENOUS
  Administered 2015-10-23: 1 mg via INTRAVENOUS
  Administered 2015-10-23: 0.5 mg via INTRAVENOUS

## 2015-10-23 MED ORDER — IOPAMIDOL (ISOVUE-300) INJECTION 61%
INTRAVENOUS | Status: AC
  Administered 2015-10-23: 10 mL
  Filled 2015-10-23: qty 50

## 2015-10-23 NOTE — Progress Notes (Addendum)
[facility] PROGRESS NOTE  Nicholas Martin ZOX:[id number] DOB: [birth year] DOA: 10/21/2015   PCP: No primary care provider on file.     Assessment/Plan: Principal Problem:   Intractable abdominal pain Active Problems:   Gallbladder sludge   HIV (human immunodeficiency virus infection) (HCC)   Leukopenia   Hyponatremia   Transaminase or LDH elevation   H/O mitral valve repair   Common bile duct dilatation   Abdominal pain   50 y.o. male with medical history significant for HIV, CAD, and mitral valve repair who presents to the emergency department for evaluation of severe right upper quadrant pain that began yesterday.Ultrasound of the abdomen was obtained and notable for gallbladder distention with active sludge but otherwise normal appearance to the gallbladder. Also noted on abdominal ultrasound is dilatation of the common bile duct 9 mm without mass or obstructing calculus identified on the study  Assessment and plan  1. Acute cholecystitis - Severe RUQ started 7/19,  - GB sludge and CBD dilation to 9mm noted on [place] without mass or obstructing calculus seen  - Labs with mild transaminase elevation, mild lipase elevation, normal bilirubin  - HIDA scan shows nonvisualization of the gallbladder consistent with cystic duct occlusion and acute cholecystitis Doubt that the patient pain is cardiac in origin, troponin negative, EKG normal CT scan confirms acute cholecystitis, appendix poorly visualized   Review results of CT with general surgery on 7/22, continue Zosyn Updated  Dr Dwain Sarna about HIDA scan results,  Continue NPO   Biliary drainin place  By IR for  6 weeks  2. Hyponatremia  -resolved   sodium 135  - Anticipate resolution with IVF  - Repeat CMP in am   3. 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 HIV  - Attempting  to obtain records from [place]  - Managed at home with Prezcobix and Truvada  - Will continue Prezcobix here, and replace Truvada with formulary-equivalent Descovy while here  - There is some, albeit low, concern for opportunistic infection or cancer leading to the presentation, will check CD4     DVT prophylaxsis heparin  Code Status:  Full code    Family Communication: Discussed in detail with the patient, all imaging results, lab results explained to the patient   Disposition Plan:  General surgery consult, Disposition per general surgery      Consultants:  General surgery  Procedures:  None  Antibiotics: Anti-infectives    Start     Dose/Rate Route Frequency Ordered Stop   10/22/15 1200  piperacillin-tazobactam (ZOSYN) IVPB 3.375 g     3.375 g 12.5 mL/hr over 240 Minutes Intravenous Every 8 hours 10/22/15 1056     10/21/15 2300  darunavir-cobicistat (PREZCOBIX) 800-150 MG per tablet 1 tablet     1 tablet Oral Daily 10/21/15 1900     10/21/15 2300  emtricitabine-tenofovir AF (DESCOVY) 200-25 MG per tablet 1 tablet     1 tablet Oral Daily 10/21/15 1900           HPI/Subjective: Continues to have right upper quadrant and epigastric pain  Objective: Filed Vitals:   10/22/15 1802 10/22/15 2127 10/23/15 0510 10/23/15 0854  BP: 124/73 107/66 124/69 103/62  Pulse: 93 91 101 115  Temp: 100.2 F (37.9 C) 98.7 F (37.1 C) 100.8 F (38.2 C) 99.6 F (37.6  C)  TempSrc: Oral Oral Oral Oral  Resp: 18 17 17 16   Height:      Weight:  71.305 kg (157 lb 3.2 oz)    SpO2: 95% 97% 94% 96%    Intake/Output Summary (Last 24 hours) at 10/23/15 0948 Last data filed at 10/23/15 0854  Gross per 24 hour  Intake    230 ml  Output    800 ml  Net   -570 ml    Exam:  Examination:  General exam: Appears calm and comfortable  Respiratory system: Clear to auscultation. Respiratory effort normal. Cardiovascular system: S1 & S2 heard, RRR. No JVD, murmurs, rubs, gallops or  clicks. No pedal edema. Gastrointestinal system: Tender to palpation in the right upper quadrant, guarding. No organomegaly or masses felt. Normal bowel sounds heard. Central nervous system: Alert and oriented. No focal neurological deficits. Extremities: Symmetric 5 x 5 power. Skin: No rashes, lesions or ulcers Psychiatry: Judgement and insight appear normal. Mood & affect appropriate.     Data Reviewed: I have personally reviewed following labs and imaging studies  Micro Results No results found for this or any previous visit (from the past 240 hour(s)).  Radiology Reports Dg Chest 1 View  10/21/2015  CLINICAL DATA:  Right upper quadrant and flank pain 2 days with nausea and vomiting today. EXAM: CHEST 1 VIEW COMPARISON:  None. FINDINGS: Sternotomy wires are present. Lungs are adequately inflated with no lobar consolidation or effusion. Rounded nodular densities over the lung bases which may be parenchymal nodules although cannot exclude nipple densities. Cardiomediastinal silhouette is within normal. There are mild degenerate changes of the spine. IMPRESSION: No acute cardiopulmonary disease. 2 small round nodular densities over the lung bases which may be parenchymal nodules versus overlying nipples. Recommend follow-up PA chest radiograph with nipple markers. Electronically Signed   By: Elberta Fortis M.D.   On: 10/21/2015 20:26   Nm Hepatobiliary Liver Func  10/22/2015  CLINICAL DATA:  50 year old male inpatient with severe right upper quadrant abdominal pain. Sludge in gallbladder distention on sonography from 1 day prior. EXAM: NUCLEAR MEDICINE HEPATOBILIARY IMAGING TECHNIQUE: Sequential images of the abdomen were obtained out to 60 minutes following intravenous administration of radiopharmaceutical. RADIOPHARMACEUTICALS:  7.2 mCi Tc-52m Choletec IV (5.2 mCi with subsequent 2.0 mCi booster dose) COMPARISON:  10/21/2015 abdominal sonogram . FINDINGS: Prompt uptake and biliary excretion of  activity by the liver is seen. Gallbladder activity is not visualized after a total of 3 hours of imaging. Biliary activity passes into small bowel, consistent with patent common bile duct. IMPRESSION: 1. Nonvisualization of gallbladder activity after 3 hours of imaging. Findings are consistent with cystic duct occlusion and acute cholecystitis in the correct clinical setting. 2. Patent common bile duct. These results will be called to the ordering clinician or representative by the Radiologist Assistant, and communication documented in the PACS or zVision Dashboard. Electronically Signed   By: Delbert Phenix M.D.   On: 10/22/2015 16:18   Ct Abdomen Pelvis W Contrast  10/22/2015  CLINICAL DATA:  Right upper quadrant pain. EXAM: CT ABDOMEN AND PELVIS WITH CONTRAST TECHNIQUE: Multidetector CT imaging of the abdomen and pelvis was performed using the standard protocol following bolus administration of intravenous contrast. CONTRAST:  1 ISOVUE-300 IOPAMIDOL (ISOVUE-300) INJECTION 61% COMPARISON:  Ultrasound of the abdomen October 21, 2015 and HIDA scan October 22, 2015 FINDINGS: Mild dependent atelectasis.  Lung bases otherwise normal. No free air. A small amount of free fluid is seen in the pelvis and right  pericolic gutter. There is significant intra and extrahepatic biliary duct dilatation with no identified filling defect. There is also a distended gallbladder with pericholecystic fluid and wall thickening. Known sludge within the gallbladder. No hepatic masses. The portal vein remains patent. The spleen, adrenal glands, and pancreas are normal. A tiny cyst is seen in the right kidney. The left kidney is normal in appearance. The abdominal aorta is normal in caliber. No adenopathy. The stomach and small bowel are normal. The colon is normal. The appendix is prominent in caliber proximally measuring up to 9 mm. More distally, the appendix is normal in caliber. There is no definitive wall thickening. The appendix is  surrounded by fluid tracking down the right pericolic gutter from the gallbladder limiting evaluation. The pelvis demonstrates a small amount of free fluid. No adenopathy. The prostate, seminal vesicles, and bladder are within normal limits. Visualized bones are normal.  Visualized bones are normal. IMPRESSION: 1. Distended gallbladder with pericholecystic fluid and wall thickening. Based on the ultrasound, HIDA scan, and a CT scan, the findings are consistent with acute cholecystitis. There is also intra and extrahepatic biliary duct dilatation. The HIDA scan demonstrated that there is not complete ductal obstruction. However, the degree of biliary dilatation is significant and an MRCP should be considered to evaluate for filling defects within the common bile duct. 2. The appendix is poorly visualized due to surrounding fluid, tracking inferiorly from the gallbladder. While the appendix is borderline to mildly prominent in caliber proximally, it is normal in caliber distally and there is no convincing evidence of appendicitis. These results will be called to the ordering clinician or representative by the Radiologist Assistant, and communication documented in the PACS or zVision Dashboard. Electronically Signed   By: Gerome Sam III M.D   On: 10/22/2015 19:35   US Abdomen Limited  10/21/2015  CLINICAL DATA:  Right upper quadrant pain for 1 day EXAM: US ABDOMEN LIMITED - RIGHT UPPER QUADRANT COMPARISON:  None. FINDINGS: Gallbladder: The gallbladder is mildly distended without wall thickening. There is tumefactive sludge within the gallbladder. There are no shadowing echogenic foci which move as is expected with gallstones. No pericholecystic fluid is evident. No sonographic Murphy sign noted by sonographer. Common bile duct: Diameter: 9 mm, mildly dilated. No mass or calculus is evident in the biliary ductal system. Liver: No focal lesion identified. Within normal limits in parenchymal echogenicity.  IMPRESSION: Gallbladder is distended with tumefactive sludge within the gallbladder. Gallbladder otherwise appears unremarkable. There is prominence of the common bile duct at 9 mm without demonstrable mass or calculus in the biliary ductal system appreciable by ultrasound. Given these findings, it may be reasonable to correlate with nuclear medicine hepatobiliary imaging study to assess for cystic duct patency. Electronically Signed   By: Bretta Bang III M.D.   On: 10/21/2015 16:48   Dg Chest 2v Repeat Same Day  10/22/2015  CLINICAL DATA:  Evaluate possible lung nodules identified on yesterday's examination, now with nipple markers in place. EXAM: CHEST  2 VIEW COMPARISON:  10/21/2015. FINDINGS: The nodular opacities questioned at the lung bases on yesterday's examination are confirmed to represent the nipples. The nipples project more superiorly on today's examination due to slight lordotic technique. Prior sternotomy for aortic valve replacement. Cardiac silhouette mildly enlarged. Lungs clear. 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 No pleural effusions. Visualized bony thorax intact. IMPRESSION: 1. The nodular opacities questioned on yesterday's examination are in fact the nipples. No evidence of lung nodules. 2. Mild  cardiomegaly. Pulmonary venous hypertension without overt edema. No acute cardiopulmonary disease. Electronically Signed   By: Hulan Saas M.D.   On: 10/22/2015 08:13     CBC  Recent Labs Lab 10/21/15 1241 10/22/15 0536 10/23/15 0528  WBC 3.8* 8.6 8.2  HGB 14.5 14.1 13.1  HCT 41.3 41.4 38.5*  PLT 187 153 143*  MCV 86.8 87.7 88.3  MCH 30.5 29.9 30.0  MCHC 35.1 34.1 34.0  RDW 12.8 12.8 13.0    Chemistries   Recent Labs Lab 10/21/15 1241 10/22/15 0536 10/23/15 0528  NA 134* 134* 135  K 3.8 4.2 4.1  CL 103 100* 101  CO2 GLUCOSE 123* 103* 71  BUN CREATININE 0.99 1.06 1.11  CALCIUM 9.6 8.6* 8.4*  AST 83* 52*  63*  ALT 83* 61 60  ALKPHOS 114 90 97  BILITOT 0.8 1.3* 9.9*   ------------------------------------------------------------------------------------------------------------------ estimated creatinine clearance is 81.4 mL/min (by C-G formula based on Cr of 1.11). ------------------------------------------------------------------------------------------------------------------ No results for input(s): HGBA1C in the last 72 hours. ------------------------------------------------------------------------------------------------------------------ No results for input(s): CHOL, HDL, LDLCALC, TRIG, CHOLHDL, LDLDIRECT in the last 72 hours. ------------------------------------------------------------------------------------------------------------------ No results for input(s): TSH, T4TOTAL, T3FREE, THYROIDAB in the last 72 hours.  Invalid input(s): FREET3 ------------------------------------------------------------------------------------------------------------------ No results for input(s): VITAMINB12, FOLATE, FERRITIN, TIBC, IRON, RETICCTPCT in the last 72 hours.  Coagulation profile  Recent Labs Lab 10/21/15 1939  INR 1.00    No results for input(s): DDIMER in the last 72 hours.  Cardiac Enzymes  Recent Labs Lab 10/21/15 1939  TROPONINI <0.03   ------------------------------------------------------------------------------------------------------------------ Invalid input(s): POCBNP   CBG:  Recent Labs Lab 10/22/15 0748 10/23/15 0739  GLUCAP 100* 83       Studies: Dg Chest 1 View  10/21/2015  CLINICAL DATA:  Right upper quadrant and flank pain 2 days with nausea and vomiting today. EXAM: CHEST 1 VIEW COMPARISON:  None. FINDINGS: Sternotomy wires are present. Lungs are adequately inflated with no lobar consolidation or effusion. Rounded nodular densities over the lung bases which may be parenchymal nodules although cannot exclude nipple densities. Cardiomediastinal  silhouette is within normal. There are mild degenerate changes of the spine. IMPRESSION: No acute cardiopulmonary disease. 2 small round nodular densities over the lung bases which may be parenchymal nodules versus overlying nipples. Recommend follow-up PA chest radiograph with nipple markers. Electronically Signed   By: Elberta Fortis M.D.   On: 10/21/2015 20:26   Nm Hepatobiliary Liver Func  10/22/2015  CLINICAL DATA:  50 year old male inpatient with severe right upper quadrant abdominal pain. Sludge in gallbladder distention on sonography from 1 day prior. EXAM: NUCLEAR MEDICINE HEPATOBILIARY IMAGING TECHNIQUE: Sequential images of the abdomen were obtained out to 60 minutes following intravenous administration of radiopharmaceutical. RADIOPHARMACEUTICALS:  7.2 mCi Tc-73m Choletec IV (5.2 mCi with subsequent 2.0 mCi booster dose) COMPARISON:  10/21/2015 abdominal sonogram . FINDINGS: Prompt uptake and biliary excretion of activity by the liver is seen. Gallbladder activity is not visualized after a total of 3 hours of imaging. Biliary activity passes into small bowel, consistent with patent common bile duct. IMPRESSION: 1. Nonvisualization of gallbladder activity after 3 hours of imaging. Findings are consistent with cystic duct occlusion and acute cholecystitis in the correct clinical setting. 2. Patent common bile duct. These results will be called to the ordering clinician or representative by the Radiologist Assistant, and communication documented in the PACS or zVision Dashboard. Electronically Signed   By: Delbert Phenix M.D.   On: 10/22/2015  16:18   Ct Abdomen Pelvis W Contrast  10/22/2015  CLINICAL DATA:  Right upper quadrant pain. EXAM: CT ABDOMEN AND PELVIS WITH CONTRAST TECHNIQUE: Multidetector CT imaging of the abdomen and pelvis was performed using the standard protocol following bolus administration of intravenous contrast. CONTRAST:  1 ISOVUE-300 IOPAMIDOL (ISOVUE-300) INJECTION 61% COMPARISON:   Ultrasound of the abdomen October 21, 2015 and HIDA scan October 22, 2015 FINDINGS: Mild dependent atelectasis.  Lung bases otherwise normal. No free air. A small amount of free fluid is seen in the pelvis and right pericolic gutter. There is significant intra and extrahepatic biliary duct dilatation with no identified filling defect. There is also a distended gallbladder with pericholecystic fluid and wall thickening. Known sludge within the gallbladder. No hepatic masses. The portal vein remains patent. The spleen, adrenal glands, and pancreas are normal. A tiny cyst is seen in the right kidney. The left kidney is normal in appearance. The abdominal aorta is normal in caliber. No adenopathy. The stomach and small bowel are normal. The colon is normal. The appendix is prominent in caliber proximally measuring up to 9 mm. More distally, the appendix is normal in caliber. There is no definitive wall thickening. The appendix is surrounded by fluid tracking down the right pericolic gutter from the gallbladder limiting evaluation. The pelvis demonstrates a small amount of free fluid. No adenopathy. The prostate, seminal vesicles, and bladder are within normal limits. Visualized bones are normal.  Visualized bones are normal. IMPRESSION: 1. Distended gallbladder with pericholecystic fluid and wall thickening. Based on the ultrasound, HIDA scan, and a CT scan, the findings are consistent with acute cholecystitis. There is also intra and extrahepatic biliary duct dilatation. The HIDA scan demonstrated that there is not complete ductal obstruction. However, the degree of biliary dilatation is significant and an MRCP should be considered to evaluate for filling defects within the common bile duct. 2. The appendix is poorly visualized due to surrounding fluid, tracking inferiorly from the gallbladder. While the appendix is borderline to mildly prominent in caliber proximally, it is normal in caliber distally and there is no  convincing evidence of appendicitis. These results will be called to the ordering clinician or representative by the Radiologist Assistant, and communication documented in the PACS or zVision Dashboard. Electronically Signed   By: Gerome Sam III M.D   On: 10/22/2015 19:35   US Abdomen Limited  10/21/2015  CLINICAL DATA:  Right upper quadrant pain for 1 day EXAM: US ABDOMEN LIMITED - RIGHT UPPER QUADRANT COMPARISON:  None. FINDINGS: Gallbladder: The gallbladder is mildly distended without wall thickening. There is tumefactive sludge within the gallbladder. There are no shadowing echogenic foci which move as is expected with gallstones. No pericholecystic fluid is evident. No sonographic Murphy sign noted by sonographer. Common bile duct: Diameter: 9 mm, mildly dilated. No mass or calculus is evident in the biliary ductal system. Liver: No focal lesion identified. Within normal limits in parenchymal echogenicity. IMPRESSION: Gallbladder is distended with tumefactive sludge within the gallbladder. Gallbladder otherwise appears unremarkable. There is prominence of the common bile duct at 9 mm without demonstrable mass or calculus in the biliary ductal system appreciable by ultrasound. Given these findings, it may be reasonable to correlate with nuclear medicine hepatobiliary imaging study to assess for cystic duct patency. Electronically Signed   By: Bretta Bang III M.D.   On: 10/21/2015 16:48   Dg Chest 2v Repeat Same Day  10/22/2015  CLINICAL DATA:  Evaluate possible lung nodules identified on yesterday's  examination, now with nipple markers in place. EXAM: CHEST  2 VIEW COMPARISON:  10/21/2015. FINDINGS: The nodular opacities questioned at the lung bases on yesterday's examination are confirmed to represent the nipples. The nipples project more superiorly on today's examination due to slight lordotic technique. Prior sternotomy for aortic valve replacement. Cardiac silhouette mildly enlarged. Lungs  clear. 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 No pleural effusions. Visualized bony thorax intact. IMPRESSION: 1. The nodular opacities questioned on yesterday's examination are in fact the nipples. No evidence of lung nodules. 2. Mild cardiomegaly. Pulmonary venous hypertension without overt edema. No acute cardiopulmonary disease. Electronically Signed   By: Hulan Saas M.D.   On: 10/22/2015 08:13      No results found for: HGBA1C Lab Results  Component Value Date   CREATININE 1.11 10/23/2015       Scheduled Meds: . aspirin EC  81 mg Oral Daily  . darunavir-cobicistat  1 tablet Oral Daily  . emtricitabine-tenofovir AF  1 tablet Oral Daily  . heparin  5,000 Units Subcutaneous Q8H  . piperacillin-tazobactam (ZOSYN)  IV  3.375 g Intravenous Q8H   Continuous Infusions: . sodium chloride 1,000 mL (10/23/15 0032)        Time spent: >30 MINS    [facility]  [facility] Pager [phone]. If 7PM-7AM, please contact night-coverage at [email or web address], password [facility] 10/23/2015, 9:48 AM

## 2015-10-23 NOTE — Progress Notes (Signed)
Chief Complaint: Patient was seen in consultation today for acute cholecystitis at the request of Dr. Derrell Lolling  Referring Physician(s): Dr. Axel Filler  Supervising Physician: Malachy Moan  Patient Status: Inpatient  History of Present Illness: Nicholas Martin is a 50 y.o. male admitted with abd pain. Workup is now most consistent with acute cholecystitis. 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 IR is asked to place perc drain PMHx, meds, labs, imaging, allergies reviewed. Has been NPO   Past Medical History  Diagnosis Date  . HIV (human immunodeficiency virus infection) (HCC)   . Coronary artery disease   . Mitral valve disease     Past Surgical History  Procedure Laterality Date  . Valve replacement      Allergies: Review of patient's allergies indicates no known allergies.  Medications:  Current facility-administered medications:  .  0.9 %  sodium chloride infusion, , Intravenous, Continuous, Richarda Overlie, MD, Last Rate: 100 mL/hr at 10/23/15 0032, 1,000 mL at 10/23/15 0032 .  acetaminophen (TYLENOL) tablet 650 mg, 650 mg, Oral, Q6H PRN, 650 mg at 10/22/15 1818 **OR** acetaminophen (TYLENOL) suppository 650 mg, 650 mg, Rectal, Q6H PRN, Lavone Neri Opyd, MD .  aspirin EC tablet 81 mg, 81 mg, Oral, Daily, Lavone Neri Opyd, MD, 81 mg at 10/22/15 1000 .  bisacodyl (DULCOLAX) EC tablet 5 mg, 5 mg, Oral, Daily PRN, Lavone Neri Opyd, MD .  darunavir-cobicistat (PREZCOBIX) 800-150 MG per tablet 1 tablet, 1 tablet, Oral, Daily, Briscoe Deutscher, MD, 1 tablet at 10/22/15 [phone] .  emtricitabine-tenofovir AF (DESCOVY) 200-25 MG per tablet 1 tablet, 1 tablet, Oral, Daily, Briscoe Deutscher, MD, 1 tablet at 10/22/15 0643 .  heparin injection 5,000 Units, 5,000 Units, Subcutaneous, Q8H, Briscoe Deutscher, MD, 5,000 Units at 10/23/15 0029 .  HYDROcodone-acetaminophen (NORCO/VICODIN) 5-325 MG per tablet 1-2 tablet, 1-2  tablet, Oral, Q4H PRN, Lavone Neri Opyd, MD .  HYDROmorphone (DILAUDID) injection 1 mg, 1 mg, Intravenous, Q3H PRN, Briscoe Deutscher, MD, 1 mg at 10/23/15 0815 .  ondansetron (ZOFRAN) tablet 4 mg, 4 mg, Oral, Q6H PRN **OR** ondansetron (ZOFRAN) injection 4 mg, 4 mg, Intravenous, Q6H PRN, Lavone Neri Opyd, MD .  piperacillin-tazobactam (ZOSYN) IVPB 3.375 g, 3.375 g, Intravenous, Q8H, Jenita Seashore, RPH, 3.375 g at 10/22/15 2100 .  polyethylene glycol (MIRALAX / GLYCOLAX) packet 17 g, 17 g, Oral, Daily PRN, Briscoe Deutscher, MD    Family History  Problem Relation Age of Onset  . Family history unknown: Yes    Social History   Social History  . Marital Status: Single    Spouse Name: N/A  . Number of Children: N/A  . Years of Education: N/A   Social History Main Topics  . Smoking status: Never Smoker   . Smokeless tobacco: None  . Alcohol Use: None  . Drug Use: None  . Sexual Activity: Not Asked   Other Topics Concern  . None   Social History Narrative    Review of Systems: A 12 point ROS discussed and pertinent positives are indicated in the HPI above.  All other systems are negative.  Review of Systems  Vital Signs: BP 103/62 mmHg  Pulse 115  Temp(Src) 99.6 F (37.6 C) (Oral)  Resp 16  Ht 5\' 9"  (1.753 m)  Wt 157 lb 3.2 oz (71.305 kg)  BMI 23.20 kg/m2  SpO2 96%  Physical Exam  Constitutional: He is oriented to person,  place, and time. No distress.  HENT:  Head: Normocephalic.  Mouth/Throat: Oropharynx is clear and moist.  Neck: Normal range of motion. No tracheal deviation present.  Cardiovascular: Normal rate, regular rhythm and normal heart sounds.   Pulmonary/Chest: Effort normal and breath sounds normal. No respiratory distress. He has no wheezes. He has no rales.  Abdominal: Soft. There is tenderness. There is no guarding.  Neurological: He is alert and oriented to person, place, and time.  Skin: Skin is warm. No rash noted. He is not diaphoretic.  Psychiatric: He  has a normal mood and affect. Judgment normal.    Mallampati Score:  MD Evaluation Airway: WNL Heart: WNL Abdomen: WNL Chest/ Lungs: WNL ASA  Classification: 2 Mallampati/Airway Score: One  Imaging: Dg Chest 1 View  10/21/2015  CLINICAL DATA:  Right upper quadrant and flank pain 2 days with nausea and vomiting today. EXAM: CHEST 1 VIEW COMPARISON:  None. FINDINGS: Sternotomy wires are present. Lungs are adequately inflated with no lobar consolidation or effusion. Rounded nodular densities over the lung bases which may be parenchymal nodules although cannot exclude nipple densities. Cardiomediastinal silhouette is within normal. There are mild degenerate changes of the spine. IMPRESSION: No acute cardiopulmonary disease. 2 small round nodular densities over the lung bases which may be parenchymal nodules versus overlying nipples. Recommend follow-up PA chest radiograph with nipple markers. Electronically Signed   By: Elberta Fortis M.D.   On: 10/21/2015 20:26   Nm Hepatobiliary Liver Func  10/22/2015  CLINICAL DATA:  50 year old male inpatient with severe right upper quadrant abdominal pain. Sludge in gallbladder distention on sonography from 1 day prior. EXAM: NUCLEAR MEDICINE HEPATOBILIARY IMAGING TECHNIQUE: Sequential images of the abdomen were obtained out to 60 minutes following intravenous administration of radiopharmaceutical. RADIOPHARMACEUTICALS:  7.2 mCi Tc-4m Choletec IV (5.2 mCi with subsequent 2.0 mCi booster dose) COMPARISON:  10/21/2015 abdominal sonogram . FINDINGS: Prompt uptake and biliary excretion of activity by the liver is seen. Gallbladder activity is not visualized after a total of 3 hours of imaging. Biliary activity passes into small bowel, consistent with patent common bile duct. IMPRESSION: 1. Nonvisualization of gallbladder activity after 3 hours of imaging. Findings are consistent with cystic duct occlusion and acute cholecystitis in the correct clinical setting. 2.  Patent common bile duct. These results will be called to the ordering clinician or representative by the Radiologist Assistant, and communication documented in the PACS or zVision Dashboard. Electronically Signed   By: Delbert Phenix M.D.   On: 10/22/2015 16:18   Ct Abdomen Pelvis W Contrast  10/22/2015  CLINICAL DATA:  Right upper quadrant pain. EXAM: CT ABDOMEN AND PELVIS WITH CONTRAST TECHNIQUE: Multidetector CT imaging of the abdomen and pelvis was performed using the standard protocol following bolus administration of intravenous contrast. CONTRAST:  1 ISOVUE-300 IOPAMIDOL (ISOVUE-300) INJECTION 61% COMPARISON:  Ultrasound of the abdomen October 21, 2015 and HIDA scan October 22, 2015 FINDINGS: Mild dependent atelectasis.  Lung bases otherwise normal. No free air. A small amount of free fluid is seen in the pelvis and right pericolic gutter. There is significant intra and extrahepatic biliary duct dilatation with no identified filling defect. There is also a distended gallbladder with pericholecystic fluid and wall thickening. Known sludge within the gallbladder. No hepatic masses. The portal vein remains patent. The spleen, adrenal glands, and pancreas are normal. A tiny cyst is seen in the right kidney. The left kidney is normal in appearance. The abdominal aorta is normal in caliber. No adenopathy. The stomach  and small bowel are normal. The colon is normal. The appendix is prominent in caliber proximally measuring up to 9 mm. More distally, the appendix is normal in caliber. There is no definitive wall thickening. The appendix is surrounded by fluid tracking down the right pericolic gutter from the gallbladder limiting evaluation. The pelvis demonstrates a small amount of free fluid. No adenopathy. The prostate, seminal vesicles, and bladder are within normal limits. Visualized bones are normal.  Visualized bones are normal. IMPRESSION: 1. Distended gallbladder with pericholecystic fluid and wall thickening.  Based on the ultrasound, HIDA scan, and a CT scan, the findings are consistent with acute cholecystitis. There is also intra and extrahepatic biliary duct dilatation. The HIDA scan demonstrated that there is not complete ductal obstruction. However, the degree of biliary dilatation is significant and an MRCP should be considered to evaluate for filling defects within the common bile duct. 2. The appendix is poorly visualized due to surrounding fluid, tracking inferiorly from the gallbladder. While the appendix is borderline to mildly prominent in caliber proximally, it is normal in caliber distally and there is no convincing evidence of appendicitis. These results will be called to the ordering clinician or representative by the Radiologist Assistant, and communication documented in the PACS or zVision Dashboard. Electronically Signed   By: Gerome Sam III M.D   On: 10/22/2015 19:35   US Abdomen Limited  10/21/2015  CLINICAL DATA:  Right upper quadrant pain for 1 day EXAM: US ABDOMEN LIMITED - RIGHT UPPER QUADRANT COMPARISON:  None. FINDINGS: Gallbladder: The gallbladder is mildly distended without wall thickening. There is tumefactive sludge within the gallbladder. There are no shadowing echogenic foci which move as is expected with gallstones. No pericholecystic fluid is evident. No sonographic Murphy sign noted by sonographer. Common bile duct: Diameter: 9 mm, mildly dilated. No mass or calculus is evident in the biliary ductal system. Liver: No focal lesion identified. Within normal limits in parenchymal echogenicity. IMPRESSION: Gallbladder is distended with tumefactive sludge within the gallbladder. Gallbladder otherwise appears unremarkable. There is prominence of the common bile duct at 9 mm without demonstrable mass or calculus in the biliary ductal system appreciable by ultrasound. Given these findings, it may be reasonable to correlate with nuclear medicine hepatobiliary imaging study to assess for  cystic duct patency. Electronically Signed   By: Bretta Bang III M.D.   On: 10/21/2015 16:48   Dg Chest 2v Repeat Same Day  10/22/2015  CLINICAL DATA:  Evaluate possible lung nodules identified on yesterday's examination, now with nipple markers in place. EXAM: CHEST  2 VIEW COMPARISON:  10/21/2015. FINDINGS: The nodular opacities questioned at the lung bases on yesterday's examination are confirmed to represent the nipples. The nipples project more superiorly on today's examination due to slight lordotic technique. Prior sternotomy for aortic valve replacement. Cardiac silhouette mildly enlarged. Lungs clear. 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 No pleural effusions. Visualized bony thorax intact. IMPRESSION: 1. The nodular opacities questioned on yesterday's examination are in fact the nipples. No evidence of lung nodules. 2. Mild cardiomegaly. Pulmonary venous hypertension without overt edema. No acute cardiopulmonary disease. Electronically Signed   By: Hulan Saas M.D.   On: 10/22/2015 08:13    Labs:  CBC:  Recent Labs  10/21/15 1241 10/22/15 0536 10/23/15 0528  WBC 3.8* 8.6 8.2  HGB 14.5 14.1 13.1  HCT 41.3 41.4 38.5*  PLT 187 153 143*    COAGS:  Recent Labs  10/21/15 1939  INR 1.00  BMP:  Recent Labs  10/21/15 1241 10/22/15 0536 10/23/15 0528  NA 134* 134* 135  K 3.8 4.2 4.1  CL 103 100* 101  CO2 GLUCOSE 123* 103* 71  BUN CALCIUM 9.6 8.6* 8.4*  CREATININE 0.99 1.06 1.11  GFRNONAA >60 >60 >60  GFRAA >60 >60 >60    LIVER FUNCTION TESTS:  Recent Labs  10/21/15 1241 10/22/15 0536 10/23/15 0528  BILITOT 0.8 1.3* 9.9*  AST 83* 52* 63*  ALT 83* 61 60  ALKPHOS 114 90 97  PROT 8.5* 7.2 6.8  ALBUMIN 4.1 3.4* 2.8*    TUMOR MARKERS: No results for input(s): AFPTM, CEA, CA199, CHROMGRNA in the last 8760 hours.  Assessment and Plan: Acute cholecystitis Imaging and case reviewed with Dr.  Ezequiel Ganser and amenable to perc chole drain today Labs reviewed, anticoagulation held. Risks and Benefits discussed with the patient including, but not limited to bleeding, infection, gallbladder perforation, bile leak, sepsis or even death. All of the patient's questions were answered, patient is agreeable to proceed. Consent signed and in chart.    Thank you for this interesting consult.   A copy of this report was sent to the requesting provider on this date.  Electronically Signed: Brayton El 10/23/2015, 10:03 AM   I spent a total of 20 minutes in face to face in clinical consultation, greater than 50% of which was counseling/coordinating care for perc chole drain

## 2015-10-23 NOTE — Plan of Care (Signed)
Problem: Activity: Goal: Risk for activity intolerance will decrease Outcome: Progressing OOB to chair this morning. Assisted with adls.   Problem: Nutrition: Goal: Adequate nutrition will be maintained Outcome: Not Met (add Reason) Currently NPO

## 2015-10-23 NOTE — Progress Notes (Signed)
[facility] Progress Note     Subjective: NAD. Reports abdominal pain 5/10. No vomiting overnight. +flatus. No BM since admission  Objective: Vital signs in last 24 hours: Temp:  [98.7 F (37.1 C)-100.8 F (38.2 C)] 100.8 F (38.2 C) (07/22 0510) Pulse Rate:  [91-101] 101 (07/22 0510) Resp:  [17-18] 17 (07/22 0510) BP: (107-124)/(66-73) 124/69 mmHg (07/22 0510) SpO2:  [94 %-97 %] 94 % (07/22 0510) Weight:  [71.305 kg (157 lb 3.2 oz)] 71.305 kg (157 lb 3.2 oz) (07/21 2127)    Intake/Output from previous day: 07/21 0701 - 07/22 0700 In: 230 [P.O.:30; IV Piggyback:100] Out: 975 [Urine:975] Intake/Output this shift:    PE: Gen:  Alert, NAD, pleasant Card:  RRR, no M/G/R heard Pulm: CTABL Abd: Soft, TTP RUQ and epigastrum, no left-sided tenderness, hypoactive BS  Lab Results:   Recent Labs  10/22/15 0536 10/23/15 0528  WBC 8.6 8.2  HGB 14.1 13.1  HCT 41.4 38.5*  PLT 153 143*   BMET  Recent Labs  10/22/15 0536 10/23/15 0528  NA 134* 135  K 4.2 4.1  CL 100* 101  CO2 26 25  GLUCOSE 103* 71  BUN 10 11  CREATININE 1.06 1.11  CALCIUM 8.6* 8.4*   PT/INR  Recent Labs  10/21/15 1939  LABPROT 13.4  INR 1.00   CMP     Component Value Date/Time   NA 135 10/23/2015 0528   K 4.1 10/23/2015 0528   CL 101 10/23/2015 0528   CO2 25 10/23/2015 0528   GLUCOSE 71 10/23/2015 0528   BUN 11 10/23/2015 0528   CREATININE 1.11 10/23/2015 0528   CALCIUM 8.4* 10/23/2015 0528   PROT 6.8 10/23/2015 0528   ALBUMIN 2.8* 10/23/2015 0528   AST 63* 10/23/2015 0528   ALT 60 10/23/2015 0528   ALKPHOS 97 10/23/2015 0528   BILITOT 9.9* 10/23/2015 0528   GFRNONAA >60 10/23/2015 0528   GFRAA >60 10/23/2015 0528   Lipase     Component Value Date/Time   LIPASE 36 10/22/2015 0906       Studies/Results: Dg Chest 1 View  10/21/2015  CLINICAL DATA:  Right upper quadrant and flank pain 2 days with nausea and vomiting today. EXAM: CHEST 1 VIEW COMPARISON:   None. FINDINGS: Sternotomy wires are present. Lungs are adequately inflated with no lobar consolidation or effusion. Rounded nodular densities over the lung bases which may be parenchymal nodules although cannot exclude nipple densities. Cardiomediastinal silhouette is within normal. There are mild degenerate changes of the spine. IMPRESSION: No acute cardiopulmonary disease. 2 small round nodular densities over the lung bases which may be parenchymal nodules versus overlying nipples. Recommend follow-up PA chest radiograph with nipple markers. Electronically Signed   By: Elberta Fortis M.D.   On: 10/21/2015 20:26   Nm Hepatobiliary Liver Func  10/22/2015  CLINICAL DATA:  50 year old male inpatient with severe right upper quadrant abdominal pain. Sludge in gallbladder distention on sonography from 1 day prior. EXAM: NUCLEAR MEDICINE HEPATOBILIARY IMAGING TECHNIQUE: Sequential images of the abdomen were obtained out to 60 minutes following intravenous administration of radiopharmaceutical. RADIOPHARMACEUTICALS:  7.2 mCi Tc-63m Choletec IV (5.2 mCi with subsequent 2.0 mCi booster dose) COMPARISON:  10/21/2015 abdominal sonogram . FINDINGS: Prompt uptake and biliary excretion of activity by the liver is seen. Gallbladder activity is not visualized after a total of 3 hours of imaging. Biliary activity passes into small bowel, consistent with patent common bile duct. IMPRESSION: 1. Nonvisualization of gallbladder activity after 3 hours of imaging.  Findings are consistent with cystic duct occlusion and acute cholecystitis in the correct clinical setting. 2. Patent common bile duct. These results will be called to the ordering clinician or representative by the Radiologist Assistant, and communication documented in the PACS or zVision Dashboard. Electronically Signed   By: Delbert Martin M.D.   On: 10/22/2015 16:18   Ct Abdomen Pelvis W Contrast  10/22/2015  CLINICAL DATA:  Right upper quadrant pain. EXAM: CT ABDOMEN  AND PELVIS WITH CONTRAST TECHNIQUE: Multidetector CT imaging of the abdomen and pelvis was performed using the standard protocol following bolus administration of intravenous contrast. CONTRAST:  1 ISOVUE-300 IOPAMIDOL (ISOVUE-300) INJECTION 61% COMPARISON:  Ultrasound of the abdomen October 21, 2015 and HIDA scan October 22, 2015 FINDINGS: Mild dependent atelectasis.  Lung bases otherwise normal. No free air. A small amount of free fluid is seen in the pelvis and right pericolic gutter. There is significant intra and extrahepatic biliary duct dilatation with no identified filling defect. There is also a distended gallbladder with pericholecystic fluid and wall thickening. Known sludge within the gallbladder. No hepatic masses. The portal vein remains patent. The spleen, adrenal glands, and pancreas are normal. A tiny cyst is seen in the right kidney. The left kidney is normal in appearance. The abdominal aorta is normal in caliber. No adenopathy. The stomach and small bowel are normal. The colon is normal. The appendix is prominent in caliber proximally measuring up to 9 mm. More distally, the appendix is normal in caliber. There is no definitive wall thickening. The appendix is surrounded by fluid tracking down the right pericolic gutter from the gallbladder limiting evaluation. The pelvis demonstrates a small amount of free fluid. No adenopathy. The prostate, seminal vesicles, and bladder are within normal limits. Visualized bones are normal.  Visualized bones are normal. IMPRESSION: 1. Distended gallbladder with pericholecystic fluid and wall thickening. Based on the ultrasound, HIDA scan, and a CT scan, the findings are consistent with acute cholecystitis. There is also intra and extrahepatic biliary duct dilatation. The HIDA scan demonstrated that there is not complete ductal obstruction. However, the degree of biliary dilatation is significant and an MRCP should be considered to evaluate for filling defects within  the common bile duct. 2. The appendix is poorly visualized due to surrounding fluid, tracking inferiorly from the gallbladder. While the appendix is borderline to mildly prominent in caliber proximally, it is normal in caliber distally and there is no convincing evidence of appendicitis. These results will be called to the ordering clinician or representative by the Radiologist Assistant, and communication documented in the PACS or zVision Dashboard. Electronically Signed   By: Gerome Sam III M.D   On: 10/22/2015 19:35   US Abdomen Limited  10/21/2015  CLINICAL DATA:  Right upper quadrant pain for 1 day EXAM: US ABDOMEN LIMITED - RIGHT UPPER QUADRANT COMPARISON:  None. FINDINGS: Gallbladder: The gallbladder is mildly distended without wall thickening. There is tumefactive sludge within the gallbladder. There are no shadowing echogenic foci which move as is expected with gallstones. No pericholecystic fluid is evident. No sonographic Murphy sign noted by sonographer. Common bile duct: Diameter: 9 mm, mildly dilated. No mass or calculus is evident in the biliary ductal system. Liver: No focal lesion identified. Within normal limits in parenchymal echogenicity. IMPRESSION: Gallbladder is distended with tumefactive sludge within the gallbladder. Gallbladder otherwise appears unremarkable. There is prominence of the common bile duct at 9 mm without demonstrable mass or calculus in the biliary ductal system appreciable by ultrasound.  Given these findings, it may be reasonable to correlate with nuclear medicine hepatobiliary imaging study to assess for cystic duct patency. Electronically Signed   By: Bretta Bang III M.D.   On: 10/21/2015 16:48   Dg Chest 2v Repeat Same Day  10/22/2015  CLINICAL DATA:  Evaluate possible lung nodules identified on yesterday's examination, now with nipple markers in place. EXAM: CHEST  2 VIEW COMPARISON:  10/21/2015. FINDINGS: The nodular opacities questioned at the lung  bases on yesterday's examination are confirmed to represent the nipples. The nipples project more superiorly on today's examination due to slight lordotic technique. Prior sternotomy for aortic valve replacement. Cardiac silhouette mildly enlarged. Lungs clear. 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 No pleural effusions. Visualized bony thorax intact. IMPRESSION: 1. The nodular opacities questioned on yesterday's examination are in fact the nipples. No evidence of lung nodules. 2. Mild cardiomegaly. Pulmonary venous hypertension without overt edema. No acute cardiopulmonary disease. Electronically Signed   By: Hulan Saas M.D.   On: 10/22/2015 08:13    Anti-infectives: Anti-infectives    Start     Dose/Rate Route Frequency Ordered Stop   10/22/15 1200  piperacillin-tazobactam (ZOSYN) IVPB 3.375 g     3.375 g 12.5 mL/hr over 240 Minutes Intravenous Every 8 hours 10/22/15 1056     10/21/15 2300  darunavir-cobicistat (PREZCOBIX) 800-150 MG per tablet 1 tablet     1 tablet Oral Daily 10/21/15 1900     10/21/15 2300  emtricitabine-tenofovir AF (DESCOVY) 200-25 MG per tablet 1 tablet     1 tablet Oral Daily 10/21/15 1900       Assessment/Plan RUQ pain Acute cholecystitis, CBD dilation (9mm) - spoke with Dr. 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 CT scan showed a "borderline to mildly prominent caliber proximally, normal caliber distally" appendix. Low suspicion for appendicitis and recommend conservative management with antibiotics. I will discuss this with MD.    Nicholas Martin , [facility] Surgery 10/23/2015, 8:44 AM Pager: [phone] Consults: [phone] Mon-Fri 7:00 am-4:30  pm Sat-Sun 7:00 am-11:30 am

## 2015-10-23 NOTE — Procedures (Signed)
Interventional Radiology Procedure Note  Procedure: Transhepatic percutaneous cholecystostomy tube placement.   Complications: None.   Estimated Blood Loss: None  Recommendations:  - Pain control, Toradol ordered - Pt required high sedative dose due to pain, watch respiratory status closely in post-procedure period - Biliary drain check in IR in 6 weeks.  - Interval cholecystectomy when deemed appropriate by surgery   Signed,  Sterling Big, MD

## 2015-10-24 DIAGNOSIS — K819 Cholecystitis, unspecified: Secondary | ICD-10-CM

## 2015-10-24 LAB — CBC
HEMATOCRIT: 33.5 % — AB (ref 39.0–52.0)
HEMOGLOBIN: 11.7 g/dL — AB (ref 13.0–17.0)
MCH: 30.2 pg (ref 26.0–34.0)
MCHC: 34.9 g/dL (ref 30.0–36.0)
MCV: 86.3 fL (ref 78.0–100.0)
Platelets: 123 10*3/uL — ABNORMAL LOW (ref 150–400)
RBC: 3.88 MIL/uL — ABNORMAL LOW (ref 4.22–5.81)
RDW: 13.1 % (ref 11.5–15.5)
WBC: 4.8 10*3/uL (ref 4.0–10.5)

## 2015-10-24 LAB — COMPREHENSIVE METABOLIC PANEL
ALBUMIN: 2.4 g/dL — AB (ref 3.5–5.0)
ALK PHOS: 84 U/L (ref 38–126)
ALT: 42 U/L (ref 17–63)
ANION GAP: 5 (ref 5–15)
AST: 43 U/L — AB (ref 15–41)
BILIRUBIN TOTAL: 11.2 mg/dL — AB (ref 0.3–1.2)
BUN: 12 mg/dL (ref 6–20)
CALCIUM: 8 mg/dL — AB (ref 8.9–10.3)
CO2: 23 mmol/L (ref 22–32)
Chloride: 107 mmol/L (ref 101–111)
Creatinine, Ser: 0.91 mg/dL (ref 0.61–1.24)
GFR calc Af Amer: >60 mL/min (ref >60)
GLUCOSE: 82 mg/dL (ref 65–99)
Potassium: 3.6 mmol/L (ref 3.5–5.1)
Sodium: 135 mmol/L (ref 135–145)
TOTAL PROTEIN: 6.2 g/dL — AB (ref 6.5–8.1)

## 2015-10-24 LAB — GLUCOSE, CAPILLARY: GLUCOSE-CAPILLARY: 76 mg/dL (ref 65–99)

## 2015-10-24 NOTE — Progress Notes (Signed)
[facility] PROGRESS NOTE  Nicholas Martin:[id number] DOB: 08-27-1965 DOA: 10/21/2015   PCP: No primary care provider on file.     Assessment/Plan: Principal Problem:   Intractable abdominal pain Active Problems:   Gallbladder sludge   HIV (human immunodeficiency virus infection) (HCC)   Leukopenia   Hyponatremia   Transaminase or LDH elevation   H/O mitral valve repair   Common bile duct dilatation   Abdominal pain   50 y.o. male with medical history significant for HIV, CAD, and mitral valve repair who presents to the emergency department for evaluation of severe right upper quadrant pain that began yesterday.Ultrasound of the abdomen was obtained and notable for gallbladder distention with active sludge but otherwise normal appearance to the gallbladder. Also noted on abdominal ultrasound is dilatation of the common bile duct 9 mm without mass or obstructing calculus identified on the study  Assessment and plan  1. Acute cholecystitis - Severe RUQ started 7/19,  - GB sludge and CBD dilation to 9mm noted on [place] without mass or obstructing calculus seen  - Labs with mild transaminase elevation, mild lipase elevation, normal bilirubin  - HIDA scan shows nonvisualization of the gallbladder consistent with cystic duct occlusion and acute cholecystitis Doubt that the patient pain is cardiac in origin, troponin negative, EKG normal CT scan confirms acute cholecystitis, appendix poorly visualized   Review results of CT with general surgery on 7/22, continue Zosyn Updated  Dr Dwain Sarna about HIDA scan results,  Continue NPO Pt required high sedative dose due to pain,  - Biliary drain check in IR in 6 weeks.  - Interval cholecystectomy when deemed appropriate by surgery Currently on clear liquid diet, will advance to a soft diet  2. Hyponatremia  -resolved   sodium 135  - Anticipate resolution with IVF  - Repeat CMP in am    3. 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 HIV  - Managed at home with Prezcobix and Truvada  - Will continue Prezcobix here, and replace Truvada with formulary-equivalent Descovy while here  - There is some, albeit low, concern for opportunistic infection or cancer leading to the presentation,   CD4  70    DVT prophylaxsis heparin  Code Status:  Full code    Family Communication: Discussed in detail with the patient, all imaging results, lab results explained to the patient   Disposition Plan:  General surgery consult, Disposition per general surgery      Consultants:  General surgery  Procedures: Transhepatic percutaneous cholecystostomy tube placement.   Antibiotics:  Zosyn Anti-infectives    Start     Dose/Rate Route Frequency Ordered Stop   10/23/15 1320  emtricitabine-tenofovir AF (DESCOVY) 200-25 MG per tablet 1 tablet     1 tablet Oral Daily 10/23/15 1321     10/23/15 1320  darunavir-cobicistat (PREZCOBIX) 800-150 MG per tablet 1 tablet     1 tablet Oral Daily 10/23/15 1321        HPI/Subjective: Continues to have right upper quadrant and epigastric pain  Objective: Vitals:   10/23/15 1844 10/23/15 2100 10/24/15 0525 10/24/15 0919  BP: 122/72 119/61 108/65 123/78  Pulse: 95 100 81 83  Resp: 16 14 16 16   Temp: 99.2 F (37.3 C) 99.5 F (37.5 C) 97.7 F (36.5 C) 98.7 F (37.1 C)  TempSrc: Oral Oral Oral Oral  SpO2: 94% 92% 93% 95%  Weight:  71.2 kg (157 lb)    Height:        Intake/Output Summary (Last 24 hours) at 10/24/15 6979 Last data filed at 10/24/15 0919  Gross per 24 hour  Intake          4271.67 ml  Output             2000 ml  Net          2271.67 ml    Exam:  Examination:  General exam: Appears calm and comfortable  Respiratory system: Clear to auscultation. Respiratory effort normal. Cardiovascular system: S1 & S2 heard,  RRR. No JVD, murmurs, rubs, gallops or clicks. No pedal edema. Gastrointestinal system: Tender to palpation in the right upper quadrant, guarding. No organomegaly or masses felt. Normal bowel sounds heard. Central nervous system: Alert and oriented. No focal neurological deficits. Extremities: Symmetric 5 x 5 power. Skin: No rashes, lesions or ulcers Psychiatry: Judgement and insight appear normal. Mood & affect appropriate.     Data Reviewed: I have personally reviewed following labs and imaging studies  Micro Results Recent Results (from the past 240 hour(s))  Culture, blood (routine x 2)     Status: None (Preliminary result)   Collection Time: 10/22/15  8:35 PM  Result Value Ref Range Status   Specimen Description BLOOD LEFT HAND  Final   Special Requests BOTTLES DRAWN AEROBIC ONLY 5CC  Final   Culture NO GROWTH < 24 HOURS  Final   Report Status PENDING  Incomplete  Body fluid culture     Status: None (Preliminary result)   Collection Time: 10/23/15  1:24 PM  Result Value Ref Range Status   Specimen Description FLUID BILE GALL BLADDER  Final   Special Requests Immunocompromised  Final   Gram Stain   Final    NO WBC SEEN MODERATE GRAM POSITIVE COCCI IN PAIRS IN CHAINS    Culture CULTURE REINCUBATED FOR BETTER GROWTH  Final   Report Status PENDING  Incomplete    Radiology Reports Dg Chest 1 View  Result Date: 10/21/2015 CLINICAL DATA:  Right upper quadrant and flank pain 2 days with nausea and vomiting today. EXAM: CHEST 1 VIEW COMPARISON:  None. FINDINGS: Sternotomy wires are present. Lungs are adequately inflated with no lobar consolidation or effusion. Rounded nodular densities over the lung bases which may be parenchymal nodules although cannot exclude nipple densities. Cardiomediastinal silhouette is within normal. There are mild degenerate changes of the spine. IMPRESSION: No acute cardiopulmonary disease. 2 small round nodular densities over the lung bases which may be  parenchymal nodules versus overlying nipples. Recommend follow-up PA chest radiograph with nipple markers. Electronically Signed   By: Elberta Fortis M.D.   On: 10/21/2015 20:26   Nm Hepatobiliary Liver Func  Result Date: 10/22/2015 CLINICAL DATA:  49 year old male inpatient with severe right upper quadrant abdominal pain. Sludge in gallbladder distention on sonography from 1 day prior. EXAM: NUCLEAR MEDICINE HEPATOBILIARY IMAGING TECHNIQUE: Sequential images of the abdomen were obtained out to 60 minutes following intravenous administration of radiopharmaceutical. RADIOPHARMACEUTICALS:  7.2 mCi Tc-22m Choletec IV (5.2 mCi with subsequent 2.0 mCi booster dose) COMPARISON:  10/21/2015 abdominal sonogram . FINDINGS: Prompt uptake and biliary excretion of activity by the liver is seen. Gallbladder activity is not visualized after a total of 3 hours of imaging. Biliary activity passes into small bowel, consistent with patent common bile duct. IMPRESSION: 1. Nonvisualization of gallbladder activity  after 3 hours of imaging. Findings are consistent with cystic duct occlusion and acute cholecystitis in the correct clinical setting. 2. Patent common bile duct. These results will be called to the ordering clinician or representative by the Radiologist Assistant, and communication documented in the PACS or zVision Dashboard. Electronically Signed   By: Delbert Phenix M.D.   On: 10/22/2015 16:18   Ct Abdomen Pelvis W Contrast  Result Date: 10/22/2015 CLINICAL DATA:  Right upper quadrant pain. EXAM: CT ABDOMEN AND PELVIS WITH CONTRAST TECHNIQUE: Multidetector CT imaging of the abdomen and pelvis was performed using the standard protocol following bolus administration of intravenous contrast. CONTRAST:  1 ISOVUE-300 IOPAMIDOL (ISOVUE-300) INJECTION 61% COMPARISON:  Ultrasound of the abdomen October 21, 2015 and HIDA scan October 22, 2015 FINDINGS: Mild dependent atelectasis.  Lung bases otherwise normal. No free air. A small  amount of free fluid is seen in the pelvis and right pericolic gutter. There is significant intra and extrahepatic biliary duct dilatation with no identified filling defect. There is also a distended gallbladder with pericholecystic fluid and wall thickening. Known sludge within the gallbladder. No hepatic masses. The portal vein remains patent. The spleen, adrenal glands, and pancreas are normal. A tiny cyst is seen in the right kidney. The left kidney is normal in appearance. The abdominal aorta is normal in caliber. No adenopathy. The stomach and small bowel are normal. The colon is normal. The appendix is prominent in caliber proximally measuring up to 9 mm. More distally, the appendix is normal in caliber. There is no definitive wall thickening. The appendix is surrounded by fluid tracking down the right pericolic gutter from the gallbladder limiting evaluation. The pelvis demonstrates a small amount of free fluid. No adenopathy. The prostate, seminal vesicles, and bladder are within normal limits. Visualized bones are normal.  Visualized bones are normal. IMPRESSION: 1. Distended gallbladder with pericholecystic fluid and wall thickening. Based on the ultrasound, HIDA scan, and a CT scan, the findings are consistent with acute cholecystitis. There is also intra and extrahepatic biliary duct dilatation. The HIDA scan demonstrated that there is not complete ductal obstruction. However, the degree of biliary dilatation is significant and an MRCP should be considered to evaluate for filling defects within the common bile duct. 2. The appendix is poorly visualized due to surrounding fluid, tracking inferiorly from the gallbladder. While the appendix is borderline to mildly prominent in caliber proximally, it is normal in caliber distally and there is no convincing evidence of appendicitis. These results will be called to the ordering clinician or representative by the Radiologist Assistant, and communication  documented in the PACS or zVision Dashboard. Electronically Signed   By: Gerome Sam III M.D   On: 10/22/2015 19:35   US Abdomen Limited  Result Date: 10/21/2015 CLINICAL DATA:  Right upper quadrant pain for 1 day EXAM: US ABDOMEN LIMITED - RIGHT UPPER QUADRANT COMPARISON:  None. FINDINGS: Gallbladder: The gallbladder is mildly distended without wall thickening. There is tumefactive sludge within the gallbladder. There are no shadowing echogenic foci which move as is expected with gallstones. No pericholecystic fluid is evident. No sonographic Murphy sign noted by sonographer. Common bile duct: Diameter: 9 mm, mildly dilated. No mass or calculus is evident in the biliary ductal system. Liver: No focal lesion identified. Within normal limits in parenchymal echogenicity. IMPRESSION: Gallbladder is distended with tumefactive sludge within the gallbladder. Gallbladder otherwise appears unremarkable. There is prominence of the common bile duct at 9 mm without demonstrable mass or calculus in  the biliary ductal system appreciable by ultrasound. Given these findings, it may be reasonable to correlate with nuclear medicine hepatobiliary imaging study to assess for cystic duct patency. Electronically Signed   By: Bretta Bang III M.D.   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 He is currently not an optimal surgical candidate secondary to his immunocompromised status and percutaneous cholecystostomy tube placement is warranted. EXAM: CHOLECYSTOSTOMY MEDICATIONS: Patient is an inpatient and currently receiving scheduled intravenous antibiotics for acute cholecystitis. No additional antibiotic prophylaxis administered. Additionally, 30 mg Toradol was administered intravenously at the end of the procedure for pain control. ANESTHESIA/SEDATION: Moderate  (conscious) sedation was employed during this procedure. A total of Versed 3 mg, 1 mg Dilaudid and Fentanyl 150 mcg was administered intravenously. Moderate Sedation Time: 30 minutes. The patient's level of consciousness and vital signs were monitored continuously by radiology nursing throughout the procedure under my direct supervision. FLUOROSCOPY TIME:  Fluoroscopy Time: 1 minutes 0 seconds (2 mGy). COMPLICATIONS: None immediate. Estimated blood loss:  0 PROCEDURE: Informed written consent was obtained from the patient after a thorough discussion of the procedural risks, benefits and alternatives. All questions were addressed. Maximal Sterile Barrier Technique was utilized including caps, mask, sterile gowns, sterile gloves, sterile drape, hand hygiene and skin antiseptic. A timeout was performed prior to the initiation of the procedure. Ultrasound was used to interrogate the right upper quadrant. The gallbladder is diffusely distended and edematous. Local anesthesia was attained by infiltration with 1% lidocaine. A small dermatotomy was made. Under real-time sonographic guidance, a 21 gauge Accustick needle was advanced through a short transhepatic course and into the gallbladder lumen. There was return of a bile through the needle. A gentle hand injection of contrast material opacifies the gallbladder lumen. The needle was removed. The Accustick sheath was advanced over the wire. The initial micro wire was then exchanged for a 0.035 J-wire. The tract was then serially dilated to 10 [place] and a Cook 10.2 [place] all-purpose drainage catheter was advanced over the wire and into the gallbladder. Approximately 100 mL thick, turbid bile was aspirated. A sample was sent for culture. A gentle hand injection of contrast material through the tube under fluoroscopy confirmed tube placement within the gallbladder lumen. An image was saved and stored. The tube was then flushed and secured to the skin with 0 Prolene suture  and a sterile bandage. The tube was hooked to gravity bag drainage. The patient experienced considerable pain during the procedure necessitating a relatively high sedation dose. IMPRESSION: Technically successful transhepatic percutaneous cholecystostomy tube placement for acute calculus cholecystitis. A culture of aspirated bile was sent. Signed, Sterling Big, MD [facility] Electronically Signed   By: Malachy Moan M.D.   On: 10/23/2015 11:25   Dg Chest 2v Repeat Same Day  Result Date: 10/22/2015 CLINICAL DATA:  Evaluate possible lung nodules identified on yesterday's examination, now with nipple markers in place. EXAM: CHEST  2 VIEW COMPARISON:  10/21/2015. FINDINGS: The nodular opacities questioned at the lung bases on yesterday's examination are confirmed to represent the nipples. The nipples project more superiorly on today's examination due to slight lordotic technique. Prior sternotomy for aortic valve replacement. Cardiac silhouette mildly enlarged. Lungs clear. 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 No pleural effusions. Visualized bony thorax intact. IMPRESSION: 1. The nodular opacities questioned on yesterday's  examination are in fact the nipples. No evidence of lung nodules. 2. Mild cardiomegaly. Pulmonary venous hypertension without overt edema. No acute cardiopulmonary disease. Electronically Signed   By: Hulan Saas M.D.   On: 10/22/2015 08:13     CBC  Recent Labs Lab 10/21/15 1241 10/22/15 0536 10/23/15 0528 10/24/15 0352  WBC 3.8* 8.6 8.2 4.8  HGB 14.5 14.1 13.1 11.7*  HCT 41.3 41.4 38.5* 33.5*  PLT 187 153 143* 123*  MCV 86.8 87.7 88.3 86.3  MCH 30.5 29.9 30.0 30.2  MCHC 35.1 34.1 34.0 34.9  RDW 12.8 12.8 13.0 13.1    Chemistries   Recent Labs Lab 10/21/15 1241 10/22/15 0536 10/23/15 0528 10/24/15 0352  NA 134* 134* 135 135  K 3.8 4.2 4.1 3.6  CL 103 100* 101  107  CO2 24 26 25 23   GLUCOSE 123* 103* 71 82  BUN 13 10 11 12   CREATININE 0.99 1.06 1.11 0.91  CALCIUM 9.6 8.6* 8.4* 8.0*  AST 83* 52* 63* 43*  ALT 83* 61 60 42  ALKPHOS 114 90 97 84  BILITOT 0.8 1.3* 9.9* 11.2*   ------------------------------------------------------------------------------------------------------------------ estimated creatinine clearance is 99.3 mL/min (by C-G formula based on SCr of 0.91 mg/dL). ------------------------------------------------------------------------------------------------------------------ No results for input(s): HGBA1C in the last 72 hours. ------------------------------------------------------------------------------------------------------------------ No results for input(s): CHOL, HDL, LDLCALC, TRIG, CHOLHDL, LDLDIRECT in the last 72 hours. ------------------------------------------------------------------------------------------------------------------ No results for input(s): TSH, T4TOTAL, T3FREE, THYROIDAB in the last 72 hours.  Invalid input(s): FREET3 ------------------------------------------------------------------------------------------------------------------ No results for input(s): VITAMINB12, FOLATE, FERRITIN, TIBC, IRON, RETICCTPCT in the last 72 hours.  Coagulation profile  Recent Labs Lab 10/21/15 1939  INR 1.00    No results for input(s): DDIMER in the last 72 hours.  Cardiac Enzymes  Recent Labs Lab 10/21/15 1939  TROPONINI <0.03   ------------------------------------------------------------------------------------------------------------------ Invalid input(s): POCBNP   CBG:  Recent Labs Lab 10/22/15 0748 10/23/15 0739 10/24/15 0742  GLUCAP 100* 83 76       Studies: Nm Hepatobiliary Liver Func  Result Date: 10/22/2015 CLINICAL DATA:  50 year old male inpatient with severe right upper quadrant abdominal pain. Sludge in gallbladder distention on sonography from 1 day prior. EXAM: NUCLEAR  MEDICINE HEPATOBILIARY IMAGING TECHNIQUE: Sequential images of the abdomen were obtained out to 60 minutes following intravenous administration of radiopharmaceutical. RADIOPHARMACEUTICALS:  7.2 mCi Tc-82m Choletec IV (5.2 mCi with subsequent 2.0 mCi booster dose) COMPARISON:  10/21/2015 abdominal sonogram . FINDINGS: Prompt uptake and biliary excretion of activity by the liver is seen. Gallbladder activity is not visualized after a total of 3 hours of imaging. Biliary activity passes into small bowel, consistent with patent common bile duct. IMPRESSION: 1. Nonvisualization of gallbladder activity after 3 hours of imaging. Findings are consistent with cystic duct occlusion and acute cholecystitis in the correct clinical setting. 2. Patent common bile duct. These results will be called to the ordering clinician or representative by the Radiologist Assistant, and communication documented in the PACS or zVision Dashboard. Electronically Signed   By: Delbert Phenix M.D.   On: 10/22/2015 16:18   Ct Abdomen Pelvis W Contrast  Result Date: 10/22/2015 CLINICAL DATA:  Right upper quadrant pain. EXAM: CT ABDOMEN AND PELVIS WITH CONTRAST TECHNIQUE: Multidetector CT imaging of the abdomen and pelvis was performed using the standard protocol following bolus administration of intravenous contrast. CONTRAST:  1 ISOVUE-300 IOPAMIDOL (ISOVUE-300) INJECTION 61% COMPARISON:  Ultrasound of the abdomen October 21, 2015 and HIDA scan October 22, 2015 FINDINGS: Mild dependent atelectasis.  Lung bases otherwise normal.  No free air. A small amount of free fluid is seen in the pelvis and right pericolic gutter. There is significant intra and extrahepatic biliary duct dilatation with no identified filling defect. There is also a distended gallbladder with pericholecystic fluid and wall thickening. Known sludge within the gallbladder. No hepatic masses. The portal vein remains patent. The spleen, adrenal glands, and pancreas are normal. A tiny  cyst is seen in the right kidney. The left kidney is normal in appearance. The abdominal aorta is normal in caliber. No adenopathy. The stomach and small bowel are normal. The colon is normal. The appendix is prominent in caliber proximally measuring up to 9 mm. More distally, the appendix is normal in caliber. There is no definitive wall thickening. The appendix is surrounded by fluid tracking down the right pericolic gutter from the gallbladder limiting evaluation. The pelvis demonstrates a small amount of free fluid. No adenopathy. The prostate, seminal vesicles, and bladder are within normal limits. Visualized bones are normal.  Visualized bones are normal. IMPRESSION: 1. Distended gallbladder with pericholecystic fluid and wall thickening. Based on the ultrasound, HIDA scan, and a CT scan, the findings are consistent with acute cholecystitis. There is also intra and extrahepatic biliary duct dilatation. The HIDA scan demonstrated that there is not complete ductal obstruction. However, the degree of biliary dilatation is significant and an MRCP should be considered to evaluate for filling defects within the common bile duct. 2. The appendix is poorly visualized due to surrounding fluid, tracking inferiorly from the gallbladder. While the appendix is borderline to mildly prominent in caliber proximally, it is normal in caliber distally and there is no convincing evidence of appendicitis. These results will be called to the ordering clinician or representative by the Radiologist Assistant, and communication documented in the PACS or zVision Dashboard. 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 He is currently not an optimal surgical candidate secondary to his immunocompromised status and percutaneous  cholecystostomy tube placement is warranted. EXAM: CHOLECYSTOSTOMY MEDICATIONS: Patient is an inpatient and currently receiving scheduled intravenous antibiotics for acute cholecystitis. No additional antibiotic prophylaxis administered. Additionally, 30 mg Toradol was administered intravenously at the end of the procedure for pain control. ANESTHESIA/SEDATION: Moderate (conscious) sedation was employed during this procedure. A total of Versed 3 mg, 1 mg Dilaudid and Fentanyl 150 mcg was administered intravenously. Moderate Sedation Time: 30 minutes. The patient's level of consciousness and vital signs were monitored continuously by radiology nursing throughout the procedure under my direct supervision. FLUOROSCOPY TIME:  Fluoroscopy Time: 1 minutes 0 seconds (2 mGy). COMPLICATIONS: None immediate. Estimated blood loss:  0 PROCEDURE: Informed written consent was obtained from the patient after a thorough discussion of the procedural risks, benefits and alternatives. All questions were addressed. Maximal Sterile Barrier Technique was utilized including caps, mask, sterile gowns, sterile gloves, sterile drape, hand hygiene and skin antiseptic. A timeout was performed prior to the initiation of the procedure. Ultrasound was used to interrogate the right upper quadrant. The gallbladder is diffusely distended and edematous. Local anesthesia was attained by infiltration with 1% lidocaine. A small dermatotomy was made. Under real-time sonographic guidance, a 21 gauge Accustick needle was advanced through a short transhepatic course and into the gallbladder lumen. There was return of a bile through the needle. A gentle hand injection of contrast  material opacifies the gallbladder lumen. The needle was removed. The Accustick sheath was advanced over the wire. The initial micro wire was then exchanged for a 0.035 J-wire. The tract was then serially dilated to 10 [place] and a Cook 10.2 [place] all-purpose drainage catheter was  advanced over the wire and into the gallbladder. Approximately 100 mL thick, turbid bile was aspirated. A sample was sent for culture. A gentle hand injection of contrast material through the tube under fluoroscopy confirmed tube placement within the gallbladder lumen. An image was saved and stored. The tube was then flushed and secured to the skin with 0 Prolene suture and a sterile bandage. The tube was hooked to gravity bag drainage. The patient experienced considerable pain during the procedure necessitating a relatively high sedation dose. IMPRESSION: Technically successful transhepatic percutaneous cholecystostomy tube placement for acute calculus cholecystitis. A culture of aspirated bile was sent. Signed, Sterling Big, MD [facility] Electronically Signed   By: Malachy Moan M.D.   On: 10/23/2015 11:25      No results found for: HGBA1C Lab Results  Component Value Date   CREATININE 0.91 10/24/2015       Scheduled Meds: . aspirin EC  81 mg Oral Daily  . darunavir-cobicistat  1 tablet Oral Daily  . emtricitabine-tenofovir AF  1 tablet Oral Daily  . heparin  5,000 Units Subcutaneous Q8H  . piperacillin-tazobactam (ZOSYN)  IV  3.375 g Intravenous Q8H   Continuous Infusions: . sodium chloride 100 mL/hr at 10/23/15 1612     LOS: 1 day    Time spent: >30 MINS    [facility]  [facility] Pager [phone]. If 7PM-7AM, please contact night-coverage at [email or web address], password [facility] 10/24/2015, 9:37 AM  LOS: 1 day

## 2015-10-24 NOTE — Progress Notes (Signed)
[facility] Progress Note     Subjective: Better. Had BM. No new issues  Objective: Vital signs in last 24 hours: Temp:  [97.7 F (36.5 C)-99.5 F (37.5 C)] 98.7 F (37.1 C) (07/23 0919) Pulse Rate:  [81-100] 83 (07/23 0919) Resp:  [14-16] 16 (07/23 0919) BP: (108-123)/(61-78) 123/78 (07/23 0919) SpO2:  [92 %-95 %] 95 % (07/23 0919) Weight:  [71.2 kg (157 lb)] 71.2 kg (157 lb) (07/22 2100)    Intake/Output from previous day: 07/22 0701 - 07/23 0700 In: 4031.7 [P.O.:960; I.V.:2971.7; IV Piggyback:100] Out: 1450 [Urine:1300; Drains:150] Intake/Output this shift: Total I/O In: 240 [P.O.:240] Out: 550 [Urine:550]  PE: Gen:  WN AA M  Alert. Card:  RRR Pulm: Clear symmetric. Abd: Mild distention.  Rare BS.  Drain in RUQ.  105 cc recorded last 24 hours.  Lab Results:   Recent Labs  10/23/15 0528 10/24/15 0352  WBC 8.2 4.8  HGB 13.1 11.7*  HCT 38.5* 33.5*  PLT 143* 123*   BMET  Recent Labs  10/23/15 0528 10/24/15 0352  NA 135 135  K 4.1 3.6  CL 101 107  CO2 25 23  GLUCOSE 71 82  BUN 11 12  CREATININE 1.11 0.91  CALCIUM 8.4* 8.0*   PT/INR  Recent Labs  10/21/15 1939  LABPROT 13.4  INR 1.00   CMP     Component Value Date/Time   NA 135 10/24/2015 0352   K 3.6 10/24/2015 0352   CL 107 10/24/2015 0352   CO2 23 10/24/2015 0352   GLUCOSE 82 10/24/2015 0352   BUN 12 10/24/2015 0352   CREATININE 0.91 10/24/2015 0352   CALCIUM 8.0 (L) 10/24/2015 0352   PROT 6.2 (L) 10/24/2015 0352   ALBUMIN 2.4 (L) 10/24/2015 0352   AST 43 (H) 10/24/2015 0352   ALT 42 10/24/2015 0352   ALKPHOS 84 10/24/2015 0352   BILITOT 11.2 (H) 10/24/2015 0352   GFRNONAA >60 10/24/2015 0352   GFRAA >60 10/24/2015 0352   Lipase     Component Value Date/Time   LIPASE 36 10/22/2015 0906       Studies/Results: Nm Hepatobiliary Liver Func  Result Date: 10/22/2015 CLINICAL DATA:  50 year old male inpatient with severe right upper quadrant abdominal pain.  Sludge in gallbladder distention on sonography from 1 day prior. EXAM: NUCLEAR MEDICINE HEPATOBILIARY IMAGING TECHNIQUE: Sequential images of the abdomen were obtained out to 60 minutes following intravenous administration of radiopharmaceutical. RADIOPHARMACEUTICALS:  7.2 mCi Tc-46m Choletec IV (5.2 mCi with subsequent 2.0 mCi booster dose) COMPARISON:  10/21/2015 abdominal sonogram . FINDINGS: Prompt uptake and biliary excretion of activity by the liver is seen. Gallbladder activity is not visualized after a total of 3 hours of imaging. Biliary activity passes into small bowel, consistent with patent common bile duct. IMPRESSION: 1. Nonvisualization of gallbladder activity after 3 hours of imaging. Findings are consistent with cystic duct occlusion and acute cholecystitis in the correct clinical setting. 2. Patent common bile duct. These results will be called to the ordering clinician or representative by the Radiologist Assistant, and communication documented in the PACS or zVision Dashboard. Electronically Signed   By: Delbert Phenix M.D.   On: 10/22/2015 16:18   Ct Abdomen Pelvis W Contrast  Result Date: 10/22/2015 CLINICAL DATA:  Right upper quadrant pain. EXAM: CT ABDOMEN AND PELVIS WITH CONTRAST TECHNIQUE: Multidetector CT imaging of the abdomen and pelvis was performed using the standard protocol following bolus administration of intravenous contrast. CONTRAST:  1 ISOVUE-300 IOPAMIDOL (ISOVUE-300) INJECTION 61% COMPARISON:  Ultrasound of the abdomen October 21, 2015 and HIDA scan October 22, 2015 FINDINGS: Mild dependent atelectasis.  Lung bases otherwise normal. No free air. A small amount of free fluid is seen in the pelvis and right pericolic gutter. There is significant intra and extrahepatic biliary duct dilatation with no identified filling defect. There is also a distended gallbladder with pericholecystic fluid and wall thickening. Known sludge within the gallbladder. No hepatic masses. The portal vein  remains patent. The spleen, adrenal glands, and pancreas are normal. A tiny cyst is seen in the right kidney. The left kidney is normal in appearance. The abdominal aorta is normal in caliber. No adenopathy. The stomach and small bowel are normal. The colon is normal. The appendix is prominent in caliber proximally measuring up to 9 mm. More distally, the appendix is normal in caliber. There is no definitive wall thickening. The appendix is surrounded by fluid tracking down the right pericolic gutter from the gallbladder limiting evaluation. The pelvis demonstrates a small amount of free fluid. No adenopathy. The prostate, seminal vesicles, and bladder are within normal limits. Visualized bones are normal.  Visualized bones are normal. IMPRESSION: 1. Distended gallbladder with pericholecystic fluid and wall thickening. Based on the ultrasound, HIDA scan, and a CT scan, the findings are consistent with acute cholecystitis. There is also intra and extrahepatic biliary duct dilatation. The HIDA scan demonstrated that there is not complete ductal obstruction. However, the degree of biliary dilatation is significant and an MRCP should be considered to evaluate for filling defects within the common bile duct. 2. The appendix is poorly visualized due to surrounding fluid, tracking inferiorly from the gallbladder. While the appendix is borderline to mildly prominent in caliber proximally, it is normal in caliber distally and there is no convincing evidence of appendicitis. These results will be called to the ordering clinician or representative by the Radiologist Assistant, and communication documented in the PACS or zVision Dashboard. 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 He is currently not an optimal  surgical candidate secondary to his immunocompromised status and percutaneous cholecystostomy tube placement is warranted. EXAM: CHOLECYSTOSTOMY MEDICATIONS: Patient is an inpatient and currently receiving scheduled intravenous antibiotics for acute cholecystitis. No additional antibiotic prophylaxis administered. Additionally, 30 mg Toradol was administered intravenously at the end of the procedure for pain control. ANESTHESIA/SEDATION: Moderate (conscious) sedation was employed during this procedure. A total of Versed 3 mg, 1 mg Dilaudid and Fentanyl 150 mcg was administered intravenously. Moderate Sedation Time: 30 minutes. The patient's level of consciousness and vital signs were monitored continuously by radiology nursing throughout the procedure under my direct supervision. FLUOROSCOPY TIME:  Fluoroscopy Time: 1 minutes 0 seconds (2 mGy). COMPLICATIONS: None immediate. Estimated blood loss:  0 PROCEDURE: Informed written consent was obtained from the patient after a thorough discussion of the procedural risks, benefits and alternatives. All questions were addressed. Maximal Sterile Barrier Technique was utilized including caps, mask, sterile gowns, sterile gloves, sterile drape, hand hygiene and skin antiseptic. A timeout was performed prior to the initiation of the procedure. Ultrasound was used to interrogate the right upper quadrant. The gallbladder is diffusely distended and edematous. Local anesthesia was attained by infiltration with 1% lidocaine. A small dermatotomy was made. Under real-time sonographic guidance, a 21 gauge Accustick needle was advanced through a short  transhepatic course and into the gallbladder lumen. There was return of a bile through the needle. A gentle hand injection of contrast material opacifies the gallbladder lumen. The needle was removed. The Accustick sheath was advanced over the wire. The initial micro wire was then exchanged for a 0.035 J-wire. The tract was then serially  dilated to 10 [place] and a Cook 10.2 [place] all-purpose drainage catheter was advanced over the wire and into the gallbladder. Approximately 100 mL thick, turbid bile was aspirated. A sample was sent for culture. A gentle hand injection of contrast material through the tube under fluoroscopy confirmed tube placement within the gallbladder lumen. An image was saved and stored. The tube was then flushed and secured to the skin with 0 Prolene suture and a sterile bandage. The tube was hooked to gravity bag drainage. The patient experienced considerable pain during the procedure necessitating a relatively high sedation dose. IMPRESSION: Technically successful transhepatic percutaneous cholecystostomy tube placement for acute calculus cholecystitis. A culture of aspirated bile was sent. Signed, Sterling Big, MD [facility] Radiology Electronically Signed   By: Malachy Moan M.D.   On: 10/23/2015 11:25    Anti-infectives: Anti-infectives    Start     Dose/Rate Route Frequency Ordered Stop   10/23/15 1320  emtricitabine-tenofovir AF (DESCOVY) 200-25 MG per tablet 1 tablet     1 tablet Oral Daily 10/23/15 1321     10/23/15 1320  darunavir-cobicistat (PREZCOBIX) 800-150 MG per tablet 1 tablet     1 tablet Oral Daily 10/23/15 1321     10/22/15 1200  piperacillin-tazobactam (ZOSYN) IVPB 3.375 g     3.375 g 12.5 mL/hr over 240 Minutes Intravenous Every 8 hours 10/22/15 1056     10/21/15 2300  darunavir-cobicistat (PREZCOBIX) 800-150 MG per tablet 1 tablet  Status:  Discontinued     1 tablet Oral Daily 10/21/15 1900 10/23/15 1321   10/21/15 2300  emtricitabine-tenofovir AF (DESCOVY) 200-25 MG per tablet 1 tablet  Status:  Discontinued     1 tablet Oral Daily 10/21/15 1900 10/23/15 1321     Assessment/Plan RUQ pain Acute cholecystitis, CBD dilation (9mm)  IR perc drain - 10/23/2015  LOS: 1 day -  HIV - CD4 70; Prezcobix and Truvada at  home Leukopenia  WBC - 4,800 - 10/24/2015 Thrombocytopenia  Plts - 123,000 - 10/24/2015  CAD S/p mitral valve repair  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  Will follow while in hospital.  Will need HIV better controlled.    Will need follow up in our office for possible interval cholecystectomy  Ovidio Kin, MD, [facility] Surgery Pager: [phone] Office phone:  [phone]

## 2015-10-25 LAB — COMPREHENSIVE METABOLIC PANEL
ALBUMIN: 2.3 g/dL — AB (ref 3.5–5.0)
ALK PHOS: 84 U/L (ref 38–126)
ALT: 45 U/L (ref 17–63)
ANION GAP: 5 (ref 5–15)
AST: 72 U/L — AB (ref 15–41)
BUN: 9 mg/dL (ref 6–20)
CALCIUM: 7.8 mg/dL — AB (ref 8.9–10.3)
CO2: 23 mmol/L (ref 22–32)
Chloride: 109 mmol/L (ref 101–111)
Creatinine, Ser: 0.86 mg/dL (ref 0.61–1.24)
GFR calc Af Amer: >60 mL/min (ref >60)
GFR calc non Af Amer: >60 mL/min (ref >60)
GLUCOSE: 95 mg/dL (ref 65–99)
POTASSIUM: 3.5 mmol/L (ref 3.5–5.1)
SODIUM: 137 mmol/L (ref 135–145)
Total Bilirubin: 6.7 mg/dL — ABNORMAL HIGH (ref 0.3–1.2)
Total Protein: 6.3 g/dL — ABNORMAL LOW (ref 6.5–8.1)

## 2015-10-25 LAB — CBC
HEMATOCRIT: 32.9 % — AB (ref 39.0–52.0)
HEMOGLOBIN: 11.5 g/dL — AB (ref 13.0–17.0)
MCH: 29.8 pg (ref 26.0–34.0)
MCHC: 35 g/dL (ref 30.0–36.0)
MCV: 85.2 fL (ref 78.0–100.0)
Platelets: 165 10*3/uL (ref 150–400)
RBC: 3.86 MIL/uL — ABNORMAL LOW (ref 4.22–5.81)
RDW: 13 % (ref 11.5–15.5)
WBC: 3 10*3/uL — ABNORMAL LOW (ref 4.0–10.5)

## 2015-10-25 LAB — GLUCOSE, CAPILLARY: Glucose-Capillary: 86 mg/dL (ref 65–99)

## 2015-10-25 MED ORDER — AMOXICILLIN-POT CLAVULANATE 875-125 MG PO TABS: 1.0000 | ORAL_TABLET | Freq: Two times a day (BID) | ORAL | 0 refills | Status: DC

## 2015-10-25 MED ORDER — HYDROCODONE-ACETAMINOPHEN 5-325 MG PO TABS: 1.0000 | ORAL_TABLET | ORAL | 0 refills | Status: DC | PRN

## 2015-10-25 MED ORDER — ZOLPIDEM TARTRATE 5 MG PO TABS
5.0000 mg | ORAL_TABLET | Freq: Once | ORAL | Status: AC
  Administered 2015-10-26: 5 mg via ORAL
  Filled 2015-10-25: qty 1

## 2015-10-25 NOTE — Discharge Summary (Addendum)
Physician Discharge Summary  Nicholas Martin MRN: [id number] DOB/AGE: Mar 24, 1966 50 y.o.  PCP: No primary care provider on file.   Admit date: 10/21/2015 Discharge date: 10/25/2015  Discharge Diagnoses:    Principal Problem:   Intractable abdominal pain Active Problems:   Gallbladder sludge   HIV (human immunodeficiency virus infection) (HCC)   Leukopenia   Hyponatremia   Transaminase or LDH elevation   H/O mitral valve repair   Common bile duct dilatation   Abdominal pain   Cholecystitis    Follow-up recommendations Follow-up with PCP in 3-5 days , including all  additional recommended appointments as below Follow-up CBC, CMP in 3-5 days Patient will be discharged home with a percutaneous drain Patient may need interval cholecystectomy , will see [facility] further evaluation Patient being discharged with home health and drain care     Current Discharge Medication List    START taking these medications   Details  amoxicillin-clavulanate (AUGMENTIN) 875-125 MG tablet Take 1 tablet by mouth 2 (two) times daily. Qty: 42 tablet, Refills: 0    HYDROcodone-acetaminophen (NORCO/VICODIN) 5-325 MG tablet Take 1-2 tablets by mouth every 4 (four) hours as needed for moderate pain. Qty: 30 tablet, Refills: 0      CONTINUE these medications which have NOT CHANGED   Details  aspirin EC 81 MG tablet Take 81 mg by mouth daily.    PREZCOBIX 800-150 MG tablet Take 1 tablet by mouth daily.    TRUVADA 200-300 MG tablet Take 1 tablet by mouth daily.         Discharge Condition: Stable   Discharge Instructions Get Medicines reviewed and adjusted: Please take all your medications with you for your next visit with your Primary MD  Please request your Primary MD to go over all hospital tests and procedure/radiological results at the follow up, please ask your Primary MD to get all Hospital records sent to his/her office.  If you experience worsening of your admission symptoms,  develop shortness of breath, life threatening emergency, suicidal or homicidal thoughts you must seek medical attention immediately by calling 911 or calling your MD immediately if symptoms less severe.  You must read complete instructions/literature along with all the possible adverse reactions/side effects for all the Medicines you take and that have been prescribed to you. Take any new Medicines after you have completely understood and accpet all the possible adverse reactions/side effects.   Do not drive when taking Pain medications.   Do not take more than prescribed Pain, Sleep and Anxiety Medications  Special Instructions: If you have smoked or chewed Tobacco in the last 2 yrs please stop smoking, stop any regular Alcohol and or any Recreational drug use.  Wear Seat belts while driving.  Please note  You were cared for by a hospitalist during your hospital stay. Once you are discharged, your primary care physician will handle any further medical issues. Please note that NO REFILLS for any discharge medications will be authorized once you are discharged, as it is imperative that you return to your primary care physician (or establish a relationship with a primary care physician if you do not have one) for your aftercare needs so that they can reassess your need for medications and monitor your lab values.  Discharge Instructions    Diet - low sodium heart healthy    Complete by:  As directed   Increase activity slowly    Complete by:  As directed       No Known Allergies  Disposition: Home   Consults: Gen. surgery Interventional radiology     Significant Diagnostic Studies:  Dg Chest 1 View  Result Date: 10/21/2015 CLINICAL DATA:  Right upper quadrant and flank pain 2 days with nausea and vomiting today. EXAM: CHEST 1 VIEW COMPARISON:  None. FINDINGS: Sternotomy wires are present. Lungs are adequately inflated with no lobar consolidation or effusion. Rounded nodular  densities over the lung bases which may be parenchymal nodules although cannot exclude nipple densities. Cardiomediastinal silhouette is within normal. There are mild degenerate changes of the spine. IMPRESSION: No acute cardiopulmonary disease. 2 small round nodular densities over the lung bases which may be parenchymal nodules versus overlying nipples. Recommend follow-up PA chest radiograph with nipple markers. Electronically Signed   By: Marin Olp M.D.   On: 10/21/2015 20:26   Nm Hepatobiliary Liver Func  Result Date: 10/22/2015 CLINICAL DATA:  50 year old male inpatient with severe right upper quadrant abdominal pain. Sludge in gallbladder distention on sonography from 1 day prior. EXAM: NUCLEAR MEDICINE HEPATOBILIARY IMAGING TECHNIQUE: Sequential images of the abdomen were obtained out to 60 minutes following intravenous administration of radiopharmaceutical. RADIOPHARMACEUTICALS:  7.2 mCi Tc-73mCholetec IV (5.2 mCi with subsequent 2.0 mCi booster dose) COMPARISON:  10/21/2015 abdominal sonogram . FINDINGS: Prompt uptake and biliary excretion of activity by the liver is seen. Gallbladder activity is not visualized after a total of 3 hours of imaging. Biliary activity passes into small bowel, consistent with patent common bile duct. IMPRESSION: 1. Nonvisualization of gallbladder activity after 3 hours of imaging. Findings are consistent with cystic duct occlusion and acute cholecystitis in the correct clinical setting. 2. Patent common bile duct. These results will be called to the ordering clinician or representative by the Radiologist Assistant, and communication documented in the PACS or zVision Dashboard. Electronically Signed   By: JIlona SorrelM.D.   On: 10/22/2015 16:18   Ct Abdomen Pelvis W Contrast  Result Date: 10/22/2015 CLINICAL DATA:  Right upper quadrant pain. EXAM: CT ABDOMEN AND PELVIS WITH CONTRAST TECHNIQUE: Multidetector CT imaging of the abdomen and pelvis was performed using  the standard protocol following bolus administration of intravenous contrast. CONTRAST:  1 ISOVUE-300 IOPAMIDOL (ISOVUE-300) INJECTION 61% COMPARISON:  Ultrasound of the abdomen October 21, 2015 and HIDA scan October 22, 2015 FINDINGS: Mild dependent atelectasis.  Lung bases otherwise normal. No free air. A small amount of free fluid is seen in the pelvis and right pericolic gutter. There is significant intra and extrahepatic biliary duct dilatation with no identified filling defect. There is also a distended gallbladder with pericholecystic fluid and wall thickening. Known sludge within the gallbladder. No hepatic masses. The portal vein remains patent. The spleen, adrenal glands, and pancreas are normal. A tiny cyst is seen in the right kidney. The left kidney is normal in appearance. The abdominal aorta is normal in caliber. No adenopathy. The stomach and small bowel are normal. The colon is normal. The appendix is prominent in caliber proximally measuring up to 9 mm. More distally, the appendix is normal in caliber. There is no definitive wall thickening. The appendix is surrounded by fluid tracking down the right pericolic gutter from the gallbladder limiting evaluation. The pelvis demonstrates a small amount of free fluid. No adenopathy. The prostate, seminal vesicles, and bladder are within normal limits. Visualized bones are normal.  Visualized bones are normal. IMPRESSION: 1. Distended gallbladder with pericholecystic fluid and wall thickening. Based on the ultrasound, HIDA scan, and a CT scan, the findings are consistent with acute  cholecystitis. There is also intra and extrahepatic biliary duct dilatation. The HIDA scan demonstrated that there is not complete ductal obstruction. However, the degree of biliary dilatation is significant and an MRCP should be considered to evaluate for filling defects within the common bile duct. 2. The appendix is poorly visualized due to surrounding fluid, tracking inferiorly  from the gallbladder. While the appendix is borderline to mildly prominent in caliber proximally, it is normal in caliber distally and there is no convincing evidence of appendicitis. These results will be called to the ordering clinician or representative by the Radiologist Assistant, and communication documented in the PACS or zVision Dashboard. Electronically Signed   By: Dorise Bullion III M.D   On: 10/22/2015 19:35   US Abdomen Limited  Result Date: 10/21/2015 CLINICAL DATA:  Right upper quadrant pain for 1 day EXAM: US ABDOMEN LIMITED - RIGHT UPPER QUADRANT COMPARISON:  None. FINDINGS: Gallbladder: The gallbladder is mildly distended without wall thickening. There is tumefactive sludge within the gallbladder. There are no shadowing echogenic foci which move as is expected with gallstones. No pericholecystic fluid is evident. No sonographic Murphy sign noted by sonographer. Common bile duct: Diameter: 9 mm, mildly dilated. No mass or calculus is evident in the biliary ductal system. Liver: No focal lesion identified. Within normal limits in parenchymal echogenicity. IMPRESSION: Gallbladder is distended with tumefactive sludge within the gallbladder. Gallbladder otherwise appears unremarkable. There is prominence of the common bile duct at 9 mm without demonstrable mass or calculus in the biliary ductal system appreciable by ultrasound. Given these findings, it may be reasonable to correlate with nuclear medicine hepatobiliary imaging study to assess for cystic duct patency. Electronically Signed   By: Lowella Grip III M.D.   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 He is currently not an optimal surgical candidate secondary to his immunocompromised status and percutaneous cholecystostomy tube placement is warranted. EXAM: CHOLECYSTOSTOMY  MEDICATIONS: Patient is an inpatient and currently receiving scheduled intravenous antibiotics for acute cholecystitis. No additional antibiotic prophylaxis administered. Additionally, 30 mg Toradol was administered intravenously at the end of the procedure for pain control. ANESTHESIA/SEDATION: Moderate (conscious) sedation was employed during this procedure. A total of Versed 3 mg, 1 mg Dilaudid and Fentanyl 150 mcg was administered intravenously. Moderate Sedation Time: 30 minutes. The patient's level of consciousness and vital signs were monitored continuously by radiology nursing throughout the procedure under my direct supervision. FLUOROSCOPY TIME:  Fluoroscopy Time: 1 minutes 0 seconds (2 mGy). COMPLICATIONS: None immediate. Estimated blood loss:  0 PROCEDURE: Informed written consent was obtained from the patient after a thorough discussion of the procedural risks, benefits and alternatives. All questions were addressed. Maximal Sterile Barrier Technique was utilized including caps, mask, sterile gowns, sterile gloves, sterile drape, hand hygiene and skin antiseptic. A timeout was performed prior to the initiation of the procedure. Ultrasound was used to interrogate the right upper quadrant. The gallbladder is diffusely distended and edematous. Local anesthesia was attained by infiltration with 1% lidocaine. A small dermatotomy was made. Under real-time sonographic guidance, a 21 gauge Accustick needle was advanced through a short transhepatic course and into the gallbladder lumen. There was return of a bile through the needle. A gentle hand injection of contrast material opacifies the gallbladder lumen. The needle was removed. The Accustick sheath was advanced over the wire. The initial micro wire was then exchanged  for a 0.035 J-wire. The tract was then serially dilated to 10 [place] and a Cook 10.2 [place] all-purpose drainage catheter was advanced over the wire and into the gallbladder. Approximately 100  mL thick, turbid bile was aspirated. A sample was sent for culture. A gentle hand injection of contrast material through the tube under fluoroscopy confirmed tube placement within the gallbladder lumen. An image was saved and stored. The tube was then flushed and secured to the skin with 0 Prolene suture and a sterile bandage. The tube was hooked to gravity bag drainage. The patient experienced considerable pain during the procedure necessitating a relatively high sedation dose. IMPRESSION: Technically successful transhepatic percutaneous cholecystostomy tube placement for acute calculus cholecystitis. A culture of aspirated bile was sent. Signed, Criselda Peaches, MD [facility] Electronically Signed   By: Jacqulynn Cadet M.D.   On: 10/23/2015 11:25   Dg Chest 2v Repeat Same Day  Result Date: 10/22/2015 CLINICAL DATA:  Evaluate possible lung nodules identified on yesterday's examination, now with nipple markers in place. EXAM: CHEST  2 VIEW COMPARISON:  10/21/2015. FINDINGS: The nodular opacities questioned at the lung bases on yesterday's examination are confirmed to represent the nipples. The nipples project more superiorly on today's examination due to slight lordotic technique. Prior sternotomy for aortic valve replacement. Cardiac silhouette mildly enlarged. Lungs clear. 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 No pleural effusions. Visualized bony thorax intact. IMPRESSION: 1. The nodular opacities questioned on yesterday's examination are in fact the nipples. No evidence of lung nodules. 2. Mild cardiomegaly. Pulmonary venous hypertension without overt edema. No acute cardiopulmonary disease. Electronically Signed   By: Evangeline Dakin M.D.   On: 10/22/2015 08:13       Filed Weights   10/22/15 2127 10/23/15 2100 10/24/15 2137  Weight: 71.3 kg (157 lb 3.2 oz) 71.2 kg (157 lb) 74.1 kg (163 lb 4.8 oz)      Microbiology: Recent Results (from the past 240 hour(s))  Culture, blood (routine x 2)     Status: None (Preliminary result)   Collection Time: 10/22/15  8:25 PM  Result Value Ref Range Status   Specimen Description BLOOD RIGHT ANTECUBITAL  Final   Special Requests IN PEDIATRIC BOTTLE 2CC  Final   Culture NO GROWTH 2 DAYS  Final   Report Status PENDING  Incomplete  Culture, blood (routine x 2)     Status: None (Preliminary result)   Collection Time: 10/22/15  8:35 PM  Result Value Ref Range Status   Specimen Description BLOOD LEFT HAND  Final   Special Requests BOTTLES DRAWN AEROBIC ONLY 5CC  Final   Culture NO GROWTH 2 DAYS  Final   Report Status PENDING  Incomplete  Body fluid culture     Status: None (Preliminary result)   Collection Time: 10/23/15  1:24 PM  Result Value Ref Range Status   Specimen Description FLUID BILE GALL BLADDER  Final   Special Requests Immunocompromised  Final   Gram Stain   Final    NO WBC SEEN MODERATE GRAM POSITIVE COCCI IN PAIRS IN CHAINS    Culture CULTURE REINCUBATED FOR BETTER GROWTH  Final   Report Status PENDING  Incomplete       Blood Culture    Component Value Date/Time   SDES FLUID BILE GALL BLADDER 10/23/2015 1324   SPECREQUEST Immunocompromised 10/23/2015 1324   CULT CULTURE REINCUBATED FOR BETTER GROWTH 10/23/2015 1324   REPTSTATUS PENDING 10/23/2015 1324  Labs: Results for orders placed or performed during the hospital encounter of 10/21/15 (from the past 48 hour(s))  Body fluid culture     Status: None (Preliminary result)   Collection Time: 10/23/15  1:24 PM  Result Value Ref Range   Specimen Description FLUID BILE GALL BLADDER    Special Requests Immunocompromised    Gram Stain      NO WBC SEEN MODERATE GRAM POSITIVE COCCI IN PAIRS IN CHAINS    Culture CULTURE REINCUBATED FOR BETTER GROWTH    Report Status PENDING   CBC     Status: Abnormal   Collection Time: 10/24/15  3:52 AM  Result Value Ref Range    WBC 4.8 4.0 - 10.5 K/uL   RBC 3.88 (L) 4.22 - 5.81 MIL/uL   Hemoglobin 11.7 (L) 13.0 - 17.0 g/dL   HCT 33.5 (L) 39.0 - 52.0 %   MCV 86.3 78.0 - 100.0 fL   MCH 30.2 26.0 - 34.0 pg   MCHC 34.9 30.0 - 36.0 g/dL   RDW 13.1 11.5 - 15.5 %   Platelets 123 (L) 150 - 400 K/uL  Comprehensive metabolic panel     Status: Abnormal   Collection Time: 10/24/15  3:52 AM  Result Value Ref Range   Sodium 135 135 - 145 mmol/L   Potassium 3.6 3.5 - 5.1 mmol/L   Chloride 107 101 - 111 mmol/L   CO2 23 22 - 32 mmol/L   Glucose, Bld 82 65 - 99 mg/dL   BUN 12 6 - 20 mg/dL   Creatinine, Ser 0.91 0.61 - 1.24 mg/dL   Calcium 8.0 (L) 8.9 - 10.3 mg/dL   Total Protein 6.2 (L) 6.5 - 8.1 g/dL   Albumin 2.4 (L) 3.5 - 5.0 g/dL   AST 43 (H) 15 - 41 U/L   ALT 42 17 - 63 U/L   Alkaline Phosphatase 84 38 - 126 U/L   Total Bilirubin 11.2 (H) 0.3 - 1.2 mg/dL   GFR calc non Af Amer >60 >60 mL/min   GFR calc Af Amer >60 >60 mL/min    Comment: (NOTE) The eGFR has been calculated using the CKD EPI equation. This calculation has not been validated in all clinical situations. eGFR's persistently <60 mL/min signify possible Chronic Kidney Disease.    Anion gap 5 5 - 15  Glucose, capillary     Status: None   Collection Time: 10/24/15  7:42 AM  Result Value Ref Range   Glucose-Capillary 76 65 - 99 mg/dL  Comprehensive metabolic panel     Status: Abnormal   Collection Time: 10/25/15  5:00 AM  Result Value Ref Range   Sodium 137 135 - 145 mmol/L   Potassium 3.5 3.5 - 5.1 mmol/L   Chloride 109 101 - 111 mmol/L   CO2 23 22 - 32 mmol/L   Glucose, Bld 95 65 - 99 mg/dL   BUN 9 6 - 20 mg/dL   Creatinine, Ser 0.86 0.61 - 1.24 mg/dL   Calcium 7.8 (L) 8.9 - 10.3 mg/dL   Total Protein 6.3 (L) 6.5 - 8.1 g/dL   Albumin 2.3 (L) 3.5 - 5.0 g/dL   AST 72 (H) 15 - 41 U/L   ALT 45 17 - 63 U/L   Alkaline Phosphatase 84 38 - 126 U/L   Total Bilirubin 6.7 (H) 0.3 - 1.2 mg/dL    Comment: DELTA CHECK NOTED   GFR calc non Af Amer >60  >60 mL/min   GFR calc Af Amer >60 >60  mL/min    Comment: (NOTE) The eGFR has been calculated using the CKD EPI equation. This calculation has not been validated in all clinical situations. eGFR's persistently <60 mL/min signify possible Chronic Kidney Disease.    Anion gap 5 5 - 15  CBC     Status: Abnormal   Collection Time: 10/25/15  5:00 AM  Result Value Ref Range   WBC 3.0 (L) 4.0 - 10.5 K/uL   RBC 3.86 (L) 4.22 - 5.81 MIL/uL   Hemoglobin 11.5 (L) 13.0 - 17.0 g/dL   HCT 32.9 (L) 39.0 - 52.0 %   MCV 85.2 78.0 - 100.0 fL   MCH 29.8 26.0 - 34.0 pg   MCHC 35.0 30.0 - 36.0 g/dL   RDW 13.0 11.5 - 15.5 %   Platelets 165 150 - 400 K/uL  Glucose, capillary     Status: None   Collection Time: 10/25/15  7:50 AM  Result Value Ref Range   Glucose-Capillary 86 65 - 99 mg/dL     Lipid Panel  No results found for: CHOL, TRIG, HDL, CHOLHDL, VLDL, LDLCALC, LDLDIRECT   No results found for: HGBA1C   Lab Results  Component Value Date   CREATININE 0.86 10/25/2015    50 y.o. male with medical history significant for HIV, CAD, and mitral valve repair who presents to the emergency department for evaluation of severe right upper quadrant pain that began yesterday.Ultrasound of the abdomen was obtained and notable for gallbladder distention with active sludge but otherwise normal appearance to the gallbladder. Also noted on abdominal ultrasound is dilatation of the common bile duct 9 mm without mass or obstructing calculus identified on the study  Hospital course  1. Acute cholecystitis - Severe RUQ started 7/19,  - GB sludge and CBD dilation to 37m noted on [place] mass or obstructing calculus seen  - Labs with mild transaminase elevation, mild lipase elevation, normal bilirubin  - HIDA scan shows nonvisualization of the gallbladder consistent with cystic duct occlusion and acute cholecystitis Doubt that the patient pain is cardiac in origin, troponin negative, EKG normal CT scan  confirms acute cholecystitis, appendix poorly visualized   Review results of CT with general surgery on 7/22, started on Zosyn, now on Augmentin For 3 weeks  Consulted  Dr WDonne Hazelhe recommended percutaneous drain  - Biliary drain check in IR in 6 weeks.  - Interval cholecystectomy when deemed appropriate by surgery, patient to follow-up with Dr. WDonne Hazel   2. Hyponatremia  -resolved   sodium 135  - Anticipate resolution with IVF  - Repeat CMP in am    3. 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 HIV  - Managed at home with Prezcobix and Truvada  - Will continue Prezcobix here, and replace Truvada with formulary-equivalent Descovy while here  -  CD4  70 , patient to discuss with his infectious disease physician about his HIV CD4 count on the timing of the surgery   Discharge Exam:   Blood pressure 135/82, pulse 69, temperature 98.7 F (37.1 C), temperature source Oral, resp. rate 18, height 5' 9"  (1.753 m), weight 74.1 kg (163 lb 4.8 oz), SpO2 99 %.  General exam: Appears calm and comfortable  Respiratory system: Clear to auscultation. Respiratory effort normal. Cardiovascular system: S1 & S2 heard, RRR. No JVD, murmurs, rubs, gallops or clicks. No  pedal edema. Gastrointestinal system: Tender to palpation in the right upper quadrant, guarding. No organomegaly or masses felt. Normal bowel sounds heard. Central nervous system: Alert and oriented. No focal neurological deficits. Extremities: Symmetric 5 x 5 power. Skin: No rashes, lesions or ulcers Psychiatry: Judgement and insight appear normal. Mood & affect appropriate.     Follow-up Information    Primary care provider. Schedule an appointment as soon as possible for a visit today.   Why:  Hospital follow-up       [facility], MD. Call in 1 day(s).   Specialty:  General  Surgery Why:  Please: Call and Make appointment for surgery follow-up Contact information: [address] [place] [address] [phone]           Signed: Reyne Dumas 10/25/2015, 10:40 AM        Time spent >45 mins

## 2015-10-25 NOTE — Progress Notes (Deleted)
Referring Physician(s): Dr. Derrell Lolling  Supervising Physician: Richarda Overlie  Patient Status:  Inpatient  Chief Complaint: Cholecystitis  Subjective: S/p perc chole Feeling better, some soreness at drain site Tol diet  Allergies: Review of patient's allergies indicates no known allergies.  Medications:  Current Facility-Administered Medications:  .  0.9 %  sodium chloride infusion, , Intravenous, Continuous, Richarda Overlie, MD, Last Rate: 75 mL/hr at 10/24/15 1214 .  acetaminophen (TYLENOL) tablet 650 mg, 650 mg, Oral, Q6H PRN, 650 mg at 10/22/15 1818 **OR** acetaminophen (TYLENOL) suppository 650 mg, 650 mg, Rectal, Q6H PRN, Lavone Neri Opyd, MD .  aspirin EC tablet 81 mg, 81 mg, Oral, Daily, Briscoe Deutscher, MD, 81 mg at 10/24/15 1612 .  bisacodyl (DULCOLAX) EC tablet 5 mg, 5 mg, Oral, Daily PRN, Lavone Neri Opyd, MD .  darunavir-cobicistat (PREZCOBIX) 800-150 MG per tablet 1 tablet, 1 tablet, Oral, Daily, Richarda Overlie, MD, 1 tablet at 10/24/15 1733 .  emtricitabine-tenofovir AF (DESCOVY) 200-25 MG per tablet 1 tablet, 1 tablet, Oral, Daily, Richarda Overlie, MD, 1 tablet at 10/24/15 1733 .  heparin injection 5,000 Units, 5,000 Units, Subcutaneous, Q8H, Briscoe Deutscher, MD, 5,000 Units at 10/25/15 0521 .  HYDROcodone-acetaminophen (NORCO/VICODIN) 5-325 MG per tablet 1-2 tablet, 1-2 tablet, Oral, Q4H PRN, Briscoe Deutscher, MD, 2 tablet at 10/23/15 2330 .  HYDROmorphone (DILAUDID) injection 1 mg, 1 mg, Intravenous, Q3H PRN, Briscoe Deutscher, MD, 1 mg at 10/23/15 2015 .  ketorolac (TORADOL) 30 MG/ML injection 30 mg, 30 mg, Intravenous, Q6H PRN, Malachy Moan, MD, 30 mg at 10/25/15 0521 .  ondansetron (ZOFRAN) tablet 4 mg, 4 mg, Oral, Q6H PRN **OR** ondansetron (ZOFRAN) injection 4 mg, 4 mg, Intravenous, Q6H PRN, Lavone Neri Opyd, MD .  piperacillin-tazobactam (ZOSYN) IVPB 3.375 g, 3.375 g, Intravenous, Q8H, Jenita Seashore, RPH, 3.375 g at 10/25/15 0437 .  polyethylene glycol (MIRALAX / GLYCOLAX) packet  17 g, 17 g, Oral, Daily PRN, Briscoe Deutscher, MD    Vital Signs: BP 135/82 (BP Location: Left Arm)   Pulse 69   Temp 98.7 F (37.1 C) (Oral)   Resp 18   Ht  (1.753 m)   Wt 163 lb 4.8 oz (74.1 kg)   SpO2 99%   BMI 24.12 kg/m   Physical Exam RUQ drain intact,site clean Output cloudy bilious with some debris  Imaging: Dg Chest 1 View  Result Date: 10/21/2015 CLINICAL DATA:  Right upper quadrant and flank pain 2 days with nausea and vomiting today. EXAM: CHEST 1 VIEW COMPARISON:  None. FINDINGS: Sternotomy wires are present. Lungs are adequately inflated with no lobar consolidation or effusion. Rounded nodular densities over the lung bases which may be parenchymal nodules although cannot exclude nipple densities. Cardiomediastinal silhouette is within normal. There are mild degenerate changes of the spine. IMPRESSION: No acute cardiopulmonary disease. 2 small round nodular densities over the lung bases which may be parenchymal nodules versus overlying nipples. Recommend follow-up PA chest radiograph with nipple markers. Electronically Signed   By: Elberta Fortis M.D.   On: 10/21/2015 20:26   Nm Hepatobiliary Liver Func  Result Date: 10/22/2015 CLINICAL DATA:  50 year old male inpatient with severe right upper quadrant abdominal pain. Sludge in gallbladder distention on sonography from 1 day prior. EXAM: NUCLEAR MEDICINE HEPATOBILIARY IMAGING TECHNIQUE: Sequential images of the abdomen were obtained out to 60 minutes following intravenous administration of radiopharmaceutical. RADIOPHARMACEUTICALS:  7.2 mCi Tc-36m Choletec IV (5.2 mCi with subsequent 2.0 mCi booster dose) COMPARISON:  10/21/2015  abdominal sonogram . FINDINGS: Prompt uptake and biliary excretion of activity by the liver is seen. Gallbladder activity is not visualized after a total of 3 hours of imaging. Biliary activity passes into small bowel, consistent with patent common bile duct. IMPRESSION: 1. Nonvisualization of  gallbladder activity after 3 hours of imaging. Findings are consistent with cystic duct occlusion and acute cholecystitis in the correct clinical setting. 2. Patent common bile duct. These results will be called to the ordering clinician or representative by the Radiologist Assistant, and communication documented in the PACS or zVision Dashboard. Electronically Signed   By: Delbert Phenix M.D.   On: 10/22/2015 16:18   Ct Abdomen Pelvis W Contrast  Result Date: 10/22/2015 CLINICAL DATA:  Right upper quadrant pain. EXAM: CT ABDOMEN AND PELVIS WITH CONTRAST TECHNIQUE: Multidetector CT imaging of the abdomen and pelvis was performed using the standard protocol following bolus administration of intravenous contrast. CONTRAST:  1 ISOVUE-300 IOPAMIDOL (ISOVUE-300) INJECTION 61% COMPARISON:  Ultrasound of the abdomen October 21, 2015 and HIDA scan October 22, 2015 FINDINGS: Mild dependent atelectasis.  Lung bases otherwise normal. No free air. A small amount of free fluid is seen in the pelvis and right pericolic gutter. There is significant intra and extrahepatic biliary duct dilatation with no identified filling defect. There is also a distended gallbladder with pericholecystic fluid and wall thickening. Known sludge within the gallbladder. No hepatic masses. The portal vein remains patent. The spleen, adrenal glands, and pancreas are normal. A tiny cyst is seen in the right kidney. The left kidney is normal in appearance. The abdominal aorta is normal in caliber. No adenopathy. The stomach and small bowel are normal. The colon is normal. The appendix is prominent in caliber proximally measuring up to 9 mm. More distally, the appendix is normal in caliber. There is no definitive wall thickening. The appendix is surrounded by fluid tracking down the right pericolic gutter from the gallbladder limiting evaluation. The pelvis demonstrates a small amount of free fluid. No adenopathy. The prostate, seminal vesicles, and bladder  are within normal limits. Visualized bones are normal.  Visualized bones are normal. IMPRESSION: 1. Distended gallbladder with pericholecystic fluid and wall thickening. Based on the ultrasound, HIDA scan, and a CT scan, the findings are consistent with acute cholecystitis. There is also intra and extrahepatic biliary duct dilatation. The HIDA scan demonstrated that there is not complete ductal obstruction. However, the degree of biliary dilatation is significant and an MRCP should be considered to evaluate for filling defects within the common bile duct. 2. The appendix is poorly visualized due to surrounding fluid, tracking inferiorly from the gallbladder. While the appendix is borderline to mildly prominent in caliber proximally, it is normal in caliber distally and there is no convincing evidence of appendicitis. These results will be called to the ordering clinician or representative by the Radiologist Assistant, and communication documented in the PACS or zVision Dashboard. Electronically Signed   By: Gerome Sam III M.D   On: 10/22/2015 19:35   US Abdomen Limited  Result Date: 10/21/2015 CLINICAL DATA:  Right upper quadrant pain for 1 day EXAM: US ABDOMEN LIMITED - RIGHT UPPER QUADRANT COMPARISON:  None. FINDINGS: Gallbladder: The gallbladder is mildly distended without wall thickening. There is tumefactive sludge within the gallbladder. There are no shadowing echogenic foci which move as is expected with gallstones. No pericholecystic fluid is evident. No sonographic Murphy sign noted by sonographer. Common bile duct: Diameter: 9 mm, mildly dilated. No mass or calculus is evident  in the biliary ductal system. Liver: No focal lesion identified. Within normal limits in parenchymal echogenicity. IMPRESSION: Gallbladder is distended with tumefactive sludge within the gallbladder. Gallbladder otherwise appears unremarkable. There is prominence of the common bile duct at 9 mm without demonstrable mass or  calculus in the biliary ductal system appreciable by ultrasound. Given these findings, it may be reasonable to correlate with nuclear medicine hepatobiliary imaging study to assess for cystic duct patency. Electronically Signed   By: Bretta Bang III M.D.   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 He is currently not an optimal surgical candidate secondary to his immunocompromised status and percutaneous cholecystostomy tube placement is warranted. EXAM: CHOLECYSTOSTOMY MEDICATIONS: Patient is an inpatient and currently receiving scheduled intravenous antibiotics for acute cholecystitis. No additional antibiotic prophylaxis administered. Additionally, 30 mg Toradol was administered intravenously at the end of the procedure for pain control. ANESTHESIA/SEDATION: Moderate (conscious) sedation was employed during this procedure. A total of Versed 3 mg, 1 mg Dilaudid and Fentanyl 150 mcg was administered intravenously. Moderate Sedation Time: 30 minutes. The patient's level of consciousness and vital signs were monitored continuously by radiology nursing throughout the procedure under my direct supervision. FLUOROSCOPY TIME:  Fluoroscopy Time: 1 minutes 0 seconds (2 mGy). COMPLICATIONS: None immediate. Estimated blood loss:  0 PROCEDURE: Informed written consent was obtained from the patient after a thorough discussion of the procedural risks, benefits and alternatives. All questions were addressed. Maximal Sterile Barrier Technique was utilized including caps, mask, sterile gowns, sterile gloves, sterile drape, hand hygiene and skin antiseptic. A timeout was performed prior to the initiation of the procedure. Ultrasound was used to interrogate the right upper quadrant. The gallbladder is diffusely distended and edematous. Local anesthesia was attained by  infiltration with 1% lidocaine. A small dermatotomy was made. Under real-time sonographic guidance, a 21 gauge Accustick needle was advanced through a short transhepatic course and into the gallbladder lumen. There was return of a bile through the needle. A gentle hand injection of contrast material opacifies the gallbladder lumen. The needle was removed. The Accustick sheath was advanced over the wire. The initial micro wire was then exchanged for a 0.035 J-wire. The tract was then serially dilated to 10 [place] and a Cook 10.2 [place] all-purpose drainage catheter was advanced over the wire and into the gallbladder. Approximately 100 mL thick, turbid bile was aspirated. A sample was sent for culture. A gentle hand injection of contrast material through the tube under fluoroscopy confirmed tube placement within the gallbladder lumen. An image was saved and stored. The tube was then flushed and secured to the skin with 0 Prolene suture and a sterile bandage. The tube was hooked to gravity bag drainage. The patient experienced considerable pain during the procedure necessitating a relatively high sedation dose. IMPRESSION: Technically successful transhepatic percutaneous cholecystostomy tube placement for acute calculus cholecystitis. A culture of aspirated bile was sent. Signed, Sterling Big, MD [facility] Electronically Signed   By: Malachy Moan M.D.   On: 10/23/2015 11:25   Dg Chest 2v Repeat Same Day  Result Date: 10/22/2015 CLINICAL DATA:  Evaluate possible lung nodules identified on yesterday's examination, now with nipple markers in place. EXAM: CHEST  2 VIEW COMPARISON:  10/21/2015. FINDINGS: The nodular opacities questioned at the lung bases on yesterday's examination are confirmed to represent the nipples. The  nipples project more superiorly on today's examination due to slight lordotic technique. Prior sternotomy for aortic valve  replacement. Cardiac silhouette mildly enlarged. Lungs clear. 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 No pleural effusions. Visualized bony thorax intact. IMPRESSION: 1. The nodular opacities questioned on yesterday's examination are in fact the nipples. No evidence of lung nodules. 2. Mild cardiomegaly. Pulmonary venous hypertension without overt edema. No acute cardiopulmonary disease. Electronically Signed   By: Hulan Saas M.D.   On: 10/22/2015 08:13    Labs:  CBC:  Recent Labs  10/22/15 0536 10/23/15 0528 10/24/15 0352 10/25/15 0500  WBC 8.6 8.2 4.8 3.0*  HGB 14.1 13.1 11.7* 11.5*  HCT 41.4 38.5* 33.5* 32.9*  PLT 153 143* 123* 165    COAGS:  Recent Labs  10/21/15 1939  INR 1.00    BMP:  Recent Labs  10/22/15 0536 10/23/15 0528 10/24/15 0352 10/25/15 0500  NA 134* 135 135 137  K 4.2 4.1 3.6 3.5  CL 100* 101 107 109  CO2 26 25 23 23   GLUCOSE 103* 71 82 95  BUN 10 11 12 9   CALCIUM 8.6* 8.4* 8.0* 7.8*  CREATININE 1.06 1.11 0.91 0.86  GFRNONAA >60 >60 >60 >60  GFRAA >60 >60 >60 >60    LIVER FUNCTION TESTS:  Recent Labs  10/22/15 0536 10/23/15 0528 10/24/15 0352 10/25/15 0500  BILITOT 1.3* 9.9* 11.2* 6.7*  AST 52* 63* 43* 72*  ALT 61 60 42 45  ALKPHOS 90 97 84 84  PROT 7.2 6.8 6.2* 6.3*  ALBUMIN 3.4* 2.8* 2.4* 2.3*    Assessment and Plan: Cholecystitis s/p perc chole Plan per [facility]  Electronically Signed: Brayton El 10/25/2015, 10:14 AM   I spent a total of 15 Minutes at the the patient's bedside AND on the patient's hospital floor or unit, greater than 50% of which was counseling/coordinating care for perc chole drain

## 2015-10-25 NOTE — Progress Notes (Signed)
Subjective: Abdominal pain resolved. Denies nausea and vomiting. VSS. WBC 3. TB 6.7.  Objective: Vital signs in last 24 hours: Temp:  [98.7 F (37.1 C)-99.6 F (37.6 C)] 98.7 F (37.1 C) (07/24 0748) Pulse Rate:  [69-82] 69 (07/24 0748) Resp:  [16-20] 18 (07/24 0748) BP: (109-163)/(65-82) 135/82 (07/24 0748) SpO2:  [95 %-99 %] 99 % (07/24 0748) Weight:  [163 lb 4.8 oz (74.1 kg)] 163 lb 4.8 oz (74.1 kg) (07/23 2137) Last BM Date: 10/24/15  Intake/Output from previous day: 07/23 0701 - 07/24 0700 In: 2960.4 [P.O.:840; I.V.:1920.4; IV Piggyback:200] Out: 1020 [Urine:950; Drains:70] Intake/Output this shift: Total I/O In: 0  Out: 300 [Urine:300]  General appearance  Supine. Awake and talkative.  Head: Scleral icterus. Normocephalic/atraumatic. PERRL bilaterally. Extraocular eye muscles in tact.  Neck: FROM Cardiovascular: Regular rate and rhythm without gallops, murmurs or rubs Respiratory: Lung sounds vesicular. No wheezes, rales or crackles auscultated Abdomen: Soft and non tender without guarding, masses, or rigidity   Lab Results:   Recent Labs  10/24/15 0352 10/25/15 0500  WBC 4.8 3.0*  HGB 11.7* 11.5*  HCT 33.5* 32.9*  PLT 123* 165   BMET  Recent Labs  10/24/15 0352 10/25/15 0500  NA 135 137  K 3.6 3.5  CL 107 109  CO2 23 23  GLUCOSE 82 95  BUN 12 9  CREATININE 0.91 0.86  CALCIUM 8.0* 7.8*   PT/INR No results for input(s): LABPROT, INR in the last 72 hours. ABG No results for input(s): PHART, HCO3 in the last 72 hours.  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 He is currently not an optimal surgical candidate secondary to his immunocompromised status and percutaneous cholecystostomy tube placement is warranted. EXAM: CHOLECYSTOSTOMY MEDICATIONS: Patient is an  inpatient and currently receiving scheduled intravenous antibiotics for acute cholecystitis. No additional antibiotic prophylaxis administered. Additionally, 30 mg Toradol was administered intravenously at the end of the procedure for pain control. ANESTHESIA/SEDATION: Moderate (conscious) sedation was employed during this procedure. A total of Versed 3 mg, 1 mg Dilaudid and Fentanyl 150 mcg was administered intravenously. Moderate Sedation Time: 30 minutes. The patient's level of consciousness and vital signs were monitored continuously by radiology nursing throughout the procedure under my direct supervision. FLUOROSCOPY TIME:  Fluoroscopy Time: 1 minutes 0 seconds (2 mGy). COMPLICATIONS: None immediate. Estimated blood loss:  0 PROCEDURE: Informed written consent was obtained from the patient after a thorough discussion of the procedural risks, benefits and alternatives. All questions were addressed. Maximal Sterile Barrier Technique was utilized including caps, mask, sterile gowns, sterile gloves, sterile drape, hand hygiene and skin antiseptic. A timeout was performed prior to the initiation of the procedure. Ultrasound was used to interrogate the right upper quadrant. The gallbladder is diffusely distended and edematous. Local anesthesia was attained by infiltration with 1% lidocaine. A small dermatotomy was made. Under real-time sonographic guidance, a 21 gauge Accustick needle was advanced through a short transhepatic course and into the gallbladder lumen. There was return of a bile through the needle. A gentle hand injection of contrast material opacifies the gallbladder lumen. The needle was removed. The Accustick sheath was advanced over the wire. The initial micro wire was then exchanged for a 0.035 J-wire. The tract was then serially dilated to 10 [place] and a Cook 10.2 [place] all-purpose drainage catheter was advanced over the wire and into the gallbladder. Approximately  100 mL thick, turbid bile was  aspirated. A sample was sent for culture. A gentle hand injection of contrast material through the tube under fluoroscopy confirmed tube placement within the gallbladder lumen. An image was saved and stored. The tube was then flushed and secured to the skin with 0 Prolene suture and a sterile bandage. The tube was hooked to gravity bag drainage. The patient experienced considerable pain during the procedure necessitating a relatively high sedation dose. IMPRESSION: Technically successful transhepatic percutaneous cholecystostomy tube placement for acute calculus cholecystitis. A culture of aspirated bile was sent. Signed, Sterling Big, MD [facility] Electronically Signed   By: Malachy Moan M.D.   On: 10/23/2015 11:25    Anti-infectives: Anti-infectives    Start     Dose/Rate Route Frequency Ordered Stop   10/25/15 0000  amoxicillin-clavulanate (AUGMENTIN) 875-125 MG tablet     1 tablet Oral 2 times daily 10/25/15 0836 11/15/15 2359   10/23/15 1320  emtricitabine-tenofovir AF (DESCOVY) 200-25 MG per tablet 1 tablet     1 tablet Oral Daily 10/23/15 1321     10/23/15 1320  darunavir-cobicistat (PREZCOBIX) 800-150 MG per tablet 1 tablet     1 tablet Oral Daily 10/23/15 1321     10/22/15 1200  piperacillin-tazobactam (ZOSYN) IVPB 3.375 g     3.375 g 12.5 mL/hr over 240 Minutes Intravenous Every 8 hours 10/22/15 1056     10/21/15 2300  darunavir-cobicistat (PREZCOBIX) 800-150 MG per tablet 1 tablet  Status:  Discontinued     1 tablet Oral Daily 10/21/15 1900 10/23/15 1321   10/21/15 2300  emtricitabine-tenofovir AF (DESCOVY) 200-25 MG per tablet 1 tablet  Status:  Discontinued     1 tablet Oral Daily 10/21/15 1900 10/23/15 1321      Assessment/Plan: HIV: CD4 70, on Prezcobix and Truvada  Acute cholecystitis  T. Bili 6.7, AST 72  S/p IR perc drain on 10/23/2015 Output 70 cc in last 24 hours  VTE: Heparin FEN: Soft diet  ID:  Transition to PO antibiotics. WBC 3.  Disp: Discussed with medicine. Home today with perc drain. Follow up for interval cholecystectomy once HIV status improves.   Orvil Feil PASII  10/25/2015

## 2015-10-25 NOTE — Progress Notes (Signed)
Two [employer] cards returned to patient from safe.

## 2015-10-25 NOTE — Plan of Care (Signed)
Problem: Activity: Goal: Risk for activity intolerance will decrease Outcome: Completed/Met Date Met: 10/25/15 Pt ambulating independently

## 2015-10-25 NOTE — Care Management (Addendum)
CM met with pt multiple times to begin d/c planning. Pt is from [place] where he was a patient receiving therapy for addiction. [facility] is declining to provide further services as they are unable to provide [facility] services and decline [facility] services to come to facility. They are d/c this pt and ask the hospital to be responsible for assisting the pt in returning to his home in [place]. Pt informed and began making arrangements to return to his home.  The pt has made arrangements with his family to return to [place]. He has a flight scheduled for Tuesday, October 26, 2015 @ 1pm. As this requires his being at the airport by 11am, we will provide information so that this pt can make transportation arrangements to the hospital  Per pt he has call [facility] re his belongings. This CM received a call from Randa Ngo , Education officer, museum at [employer] who states that they will deliver this pt belongings, D/C instructions , as well as referral in [place] for ongoing treatment.  A second call was received from DON, Brita Romp of [employer] with a generous offer to provide transportation for this pt to the airport. The [facility] vehicle will bring pt belongings and d/c info tomorrow and pick this pt up at about 11am and transport him to the airport.  Pt informed.  Adron Bene RN MPH, case manager, [phone]

## 2015-10-26 ENCOUNTER — Telehealth (HOSPITAL_BASED_OUTPATIENT_CLINIC_OR_DEPARTMENT_OTHER): Payer: Self-pay | Admitting: Emergency Medicine

## 2015-10-26 LAB — BODY FLUID CULTURE: Gram Stain: NONE SEEN

## 2015-10-26 LAB — GLUCOSE, CAPILLARY: GLUCOSE-CAPILLARY: 82 mg/dL (ref 65–99)

## 2015-10-26 MED ORDER — HYDROCODONE-ACETAMINOPHEN 5-325 MG PO TABS: 1.0000 | ORAL_TABLET | ORAL | 0 refills | Status: AC | PRN

## 2015-10-26 MED ORDER — AMOXICILLIN-POT CLAVULANATE 875-125 MG PO TABS: 1.0000 | ORAL_TABLET | Freq: Two times a day (BID) | ORAL | 0 refills | Status: AC

## 2015-10-26 NOTE — Progress Notes (Signed)
Patient for discharge. Medications and discharge instructions explained to the patient. He verbalized understanding. Copies given to him including originial prescriptions. IV saline lock removed, dressing changed to drain site, taught patient how to empty the drain and flushed. Patient verbalized understanding and performed independently.

## 2015-10-26 NOTE — Progress Notes (Signed)
From a surgical standpoint the patient is stable, perc drain in place, abdominal pain improving, t.bili trending down, LFT's normal. We continue to recommend discharge per medicine's timing. We will follow up with the patient in our office and have placed discharged instructions in his discharge information. We will sign off, please call with questions or concerns. Thank you.  Hosie Spangle, PA-C [facility] Pager: [phone] Consults: [phone] Mon-Fri 7:00 am-4:30 pm Sat-Sun 7:00 am-11:30 am

## 2015-10-26 NOTE — Care Management Note (Signed)
Case Management Note  Patient Details  Name: Nicholas Martin MRN: [id number] Date of Birth: April 01, 1966  Subjective/Objective:     CM following for progression and d/c planning.                Action/Plan: 10/26/2015 Met with pt on 10/25/2015 re [facility] needs for drain flushing and care. This pt had been taught by RN staff to flush this drain on 10/25/2015. Orders for [facility] to followup with pt re flush procedure discussed with pt and name of pt PCP obtained, Dr Birdie Riddle ph [phone] in [place]. Call placed to that office on 10/25/2015 at 2pm and 4:10pm, message left requesting return call.  Today, 10/26/2015 call place to Dr Nathaneil Canary office at 10:38 am and message left re [facility] and followup.   This CM than contacted [facility] as this agency has nationwide offices and orders given to that office. Pt has been taught to perform flushes and was given supplies for care of the drain ( dressing materials and extra flushes prior to d/c.   Expected Discharge Date:     10/26/2015             Expected Discharge Plan:  [place]  In-House Referral:  NA  Discharge planning Services  CM Consult  Post Acute Care Choice:  Home Health Choice offered to:  Patient  DME Arranged:    DME Agency:     HH Arranged:  RN [place] Agency:  [facility]  Status of Service:  Completed, signed off  If discussed at [place] of Stay Meetings, dates discussed:    Additional Comments:  Adron Bene, RN 10/26/2015, 11:21 AM

## 2015-10-27 LAB — CULTURE, BLOOD (ROUTINE X 2)
CULTURE: NO GROWTH
CULTURE: NO GROWTH

## 2015-11-01 NOTE — Care Management Note (Signed)
Case Management Note  Patient Details  Name: Nicholas Martin MRN: [id number] Date of Birth: [birth year]  Subjective/Objective:                    Action/Plan:   Expected Discharge Date:                  Expected Discharge Plan:  Home w Home Health Services  In-House Referral:  NA  Discharge planning Services  CM Consult  Post Acute Care Choice:  Home Health Choice offered to:  Patient  DME Arranged:    DME Agency:     HH Arranged:  RN HH Agency:  [facility]  Status of Service:  Completed, signed off  If discussed at Long Length of Stay Meetings, dates discussed:    Additional Comments:  [name] 11/01/2015, 9:23 AM

## 2017-02-19 IMAGING — CT CT ABD-PELV W/ CM
2 of 4 series · 11 of 46 positions shown, 12 images · IV contrast (iopamidol)
Comparison: Ultrasound of the abdomen October 21, 2015 and HIDA scan
October 22, 2015

CLINICAL DATA: Right upper quadrant pain.

EXAM:
CT ABDOMEN AND PELVIS WITH CONTRAST
TECHNIQUE: Multidetector CT imaging of the abdomen and pelvis was performed
using the standard protocol following bolus administration of
intravenous contrast.
CONTRAST:  1 P15SK3-GWW IOPAMIDOL (P15SK3-GWW) INJECTION 61%

[Series 201: routine, idose (2) · axial · 0.72mm/px · z∈[-728,-368]mm · 8 of 92 slices shown, 9 images]
[im 10/92  soft-tissue]
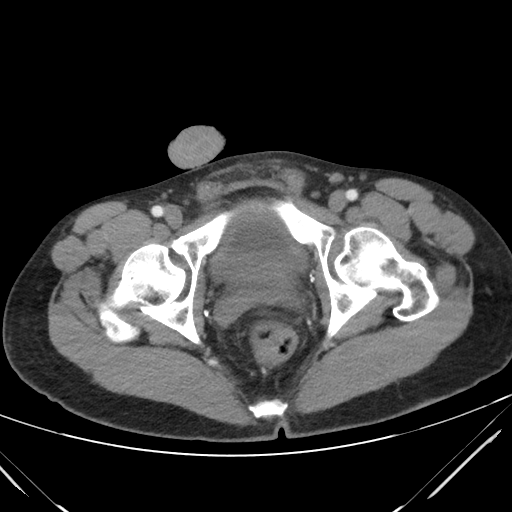
[im 10/92  bone]
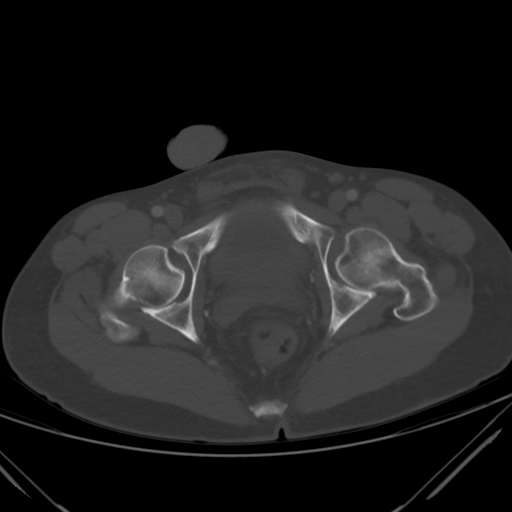
[im 19/92  soft-tissue]
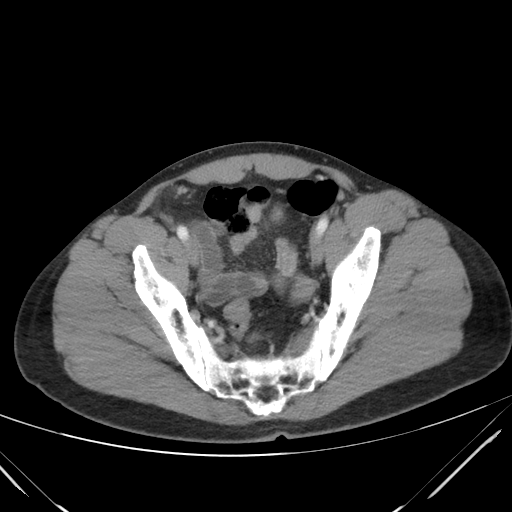
[im 28/92  soft-tissue]
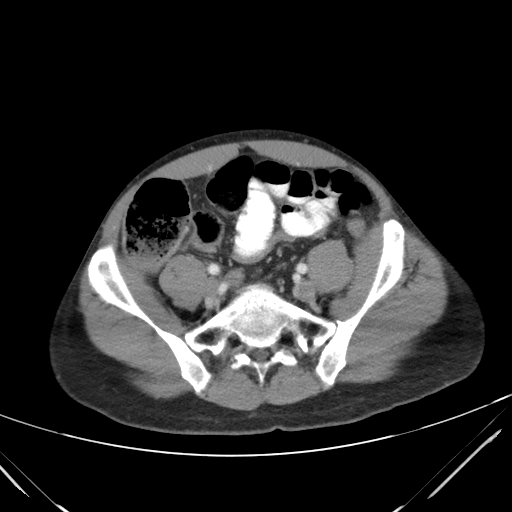
[im 41/92  soft-tissue]
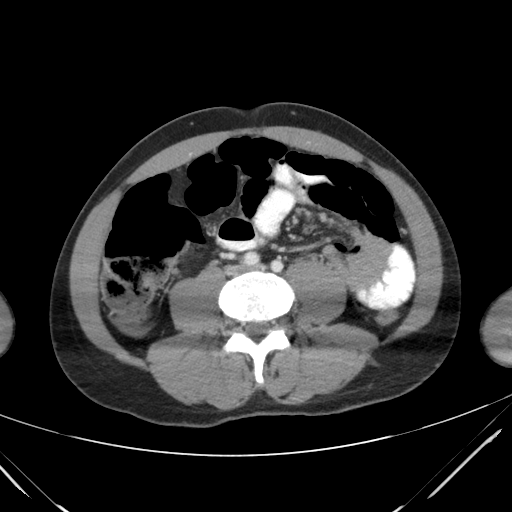
[im 51/92  soft-tissue]
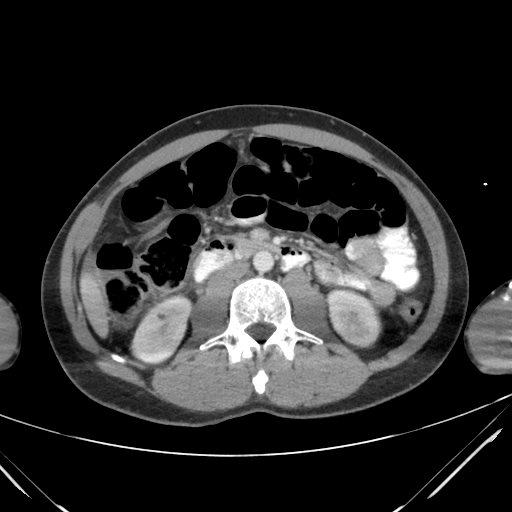
[im 64/92  soft-tissue]
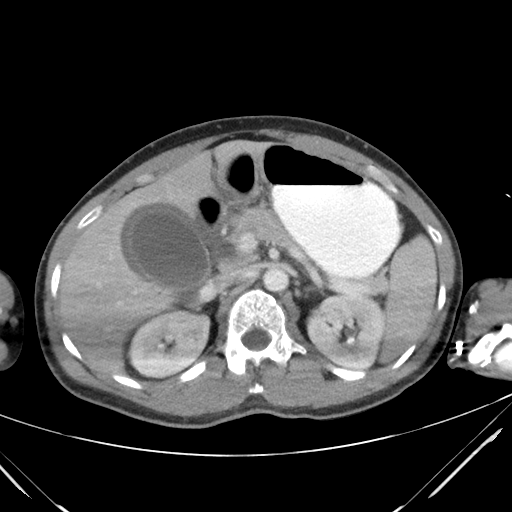
[im 73/92  soft-tissue]
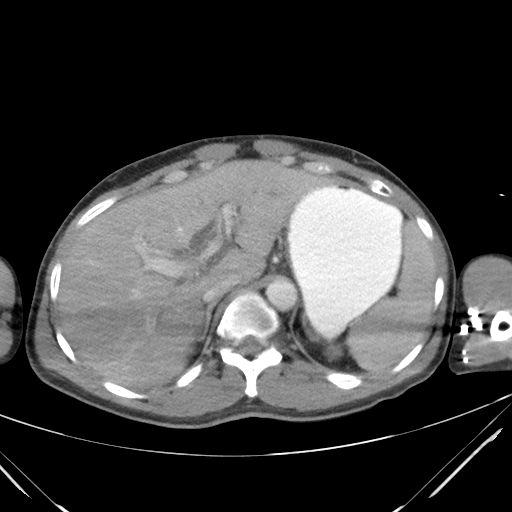
[im 82/92  soft-tissue]
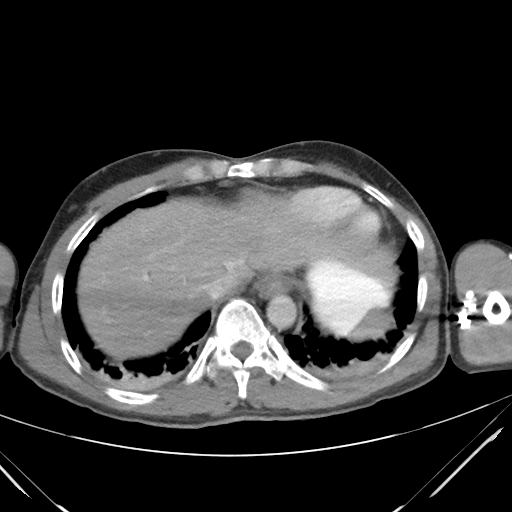

[Series 203: coronals, idose (2) · coronal · 0.45mm/px · 3 of 120 slices shown]
[im 40/120  soft-tissue]
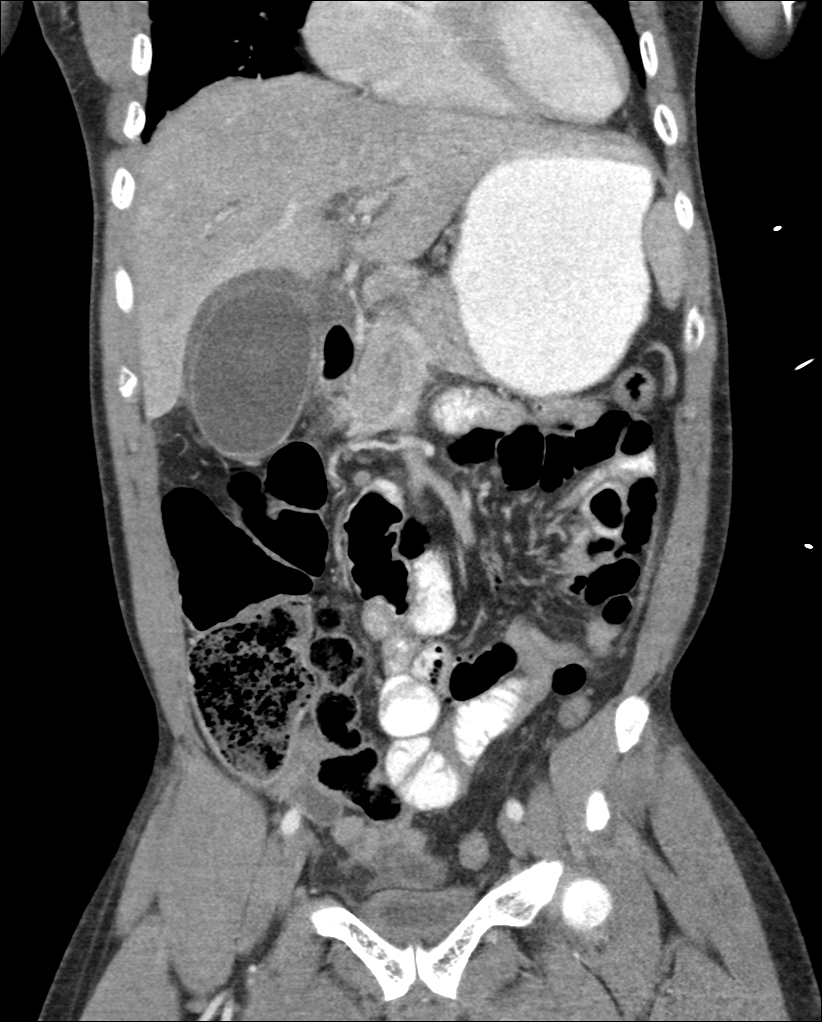
[im 53/120  soft-tissue]
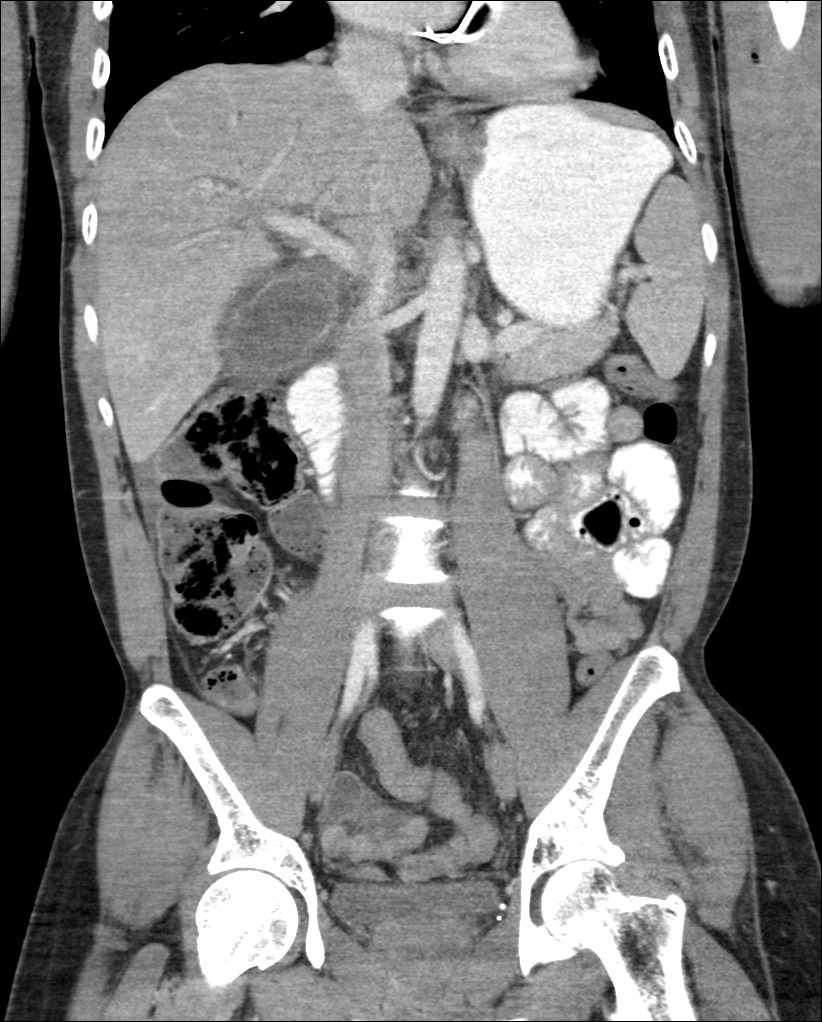
[im 67/120  soft-tissue]
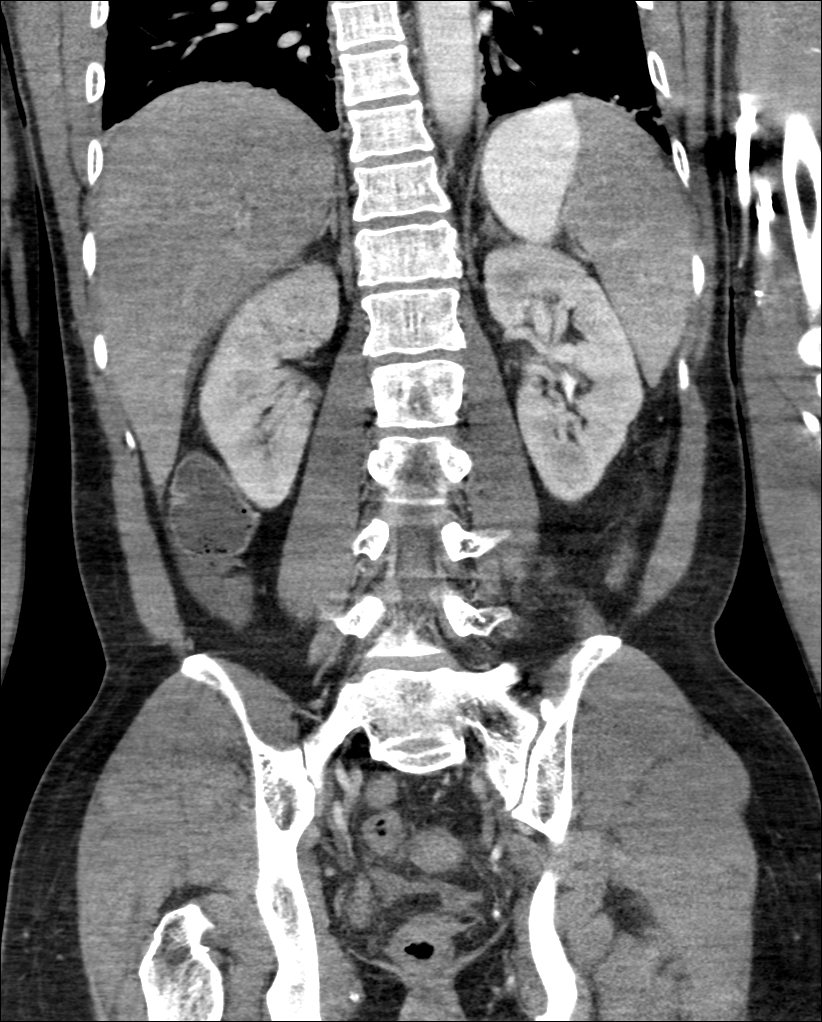

[11 of 46 positions shown; findings below may reference images not displayed]

FINDINGS: Mild dependent atelectasis.  Lung bases otherwise normal.

No free air. A small amount of free fluid is seen in the pelvis and
right pericolic gutter. There is significant intra and extrahepatic
biliary duct dilatation with no identified filling defect. There is
also a distended gallbladder with pericholecystic fluid and wall
thickening. Known sludge within the gallbladder. No hepatic masses.
The portal vein remains patent. The spleen, adrenal glands, and
pancreas are normal. A tiny cyst is seen in the right kidney. The
left kidney is normal in appearance. The abdominal aorta is normal
in caliber. No adenopathy. The stomach and small bowel are normal.
The colon is normal. The appendix is prominent in caliber proximally
measuring up to 9 mm. More distally, the appendix is normal in
caliber. There is no definitive wall thickening. The appendix is
surrounded by fluid tracking down the right pericolic gutter from
the gallbladder limiting evaluation.

The pelvis demonstrates a small amount of free fluid. No adenopathy.
The prostate, seminal vesicles, and bladder are within normal
limits.

Visualized bones are normal.  Visualized bones are normal.
IMPRESSION: 1. Distended gallbladder with pericholecystic fluid and wall
thickening. Based on the ultrasound, HIDA scan, and a CT scan, the
findings are consistent with acute cholecystitis. There is also
intra and extrahepatic biliary duct dilatation. The HIDA scan
demonstrated that there is not complete ductal obstruction. However,
the degree of biliary dilatation is significant and an MRCP should
be considered to evaluate for filling defects within the common bile
duct.
2. The appendix is poorly visualized due to surrounding fluid,
tracking inferiorly from the gallbladder. While the appendix is
borderline to mildly prominent in caliber proximally, it is normal
in caliber distally and there is no convincing evidence of
appendicitis.
These results will be called to the ordering clinician or
representative by the Radiologist Assistant, and communication
documented in the PACS or zVision Dashboard.

## 2017-02-20 IMAGING — US IR CHOLECYSTOSTOMY
1 series · 2 of 2 positions shown · non-contrast
Comparison: none

INDICATION: 49-year-old male with HIV/ AIDS and immunocompromise (CD4 count less
than 100) with acute calculus cholecystitis. He is currently not an
optimal surgical candidate secondary to his immunocompromised status
and percutaneous cholecystostomy tube placement is warranted.

[Series 1: ir cholecystostomy · 2 of 2 slices shown]
[im 1/2]
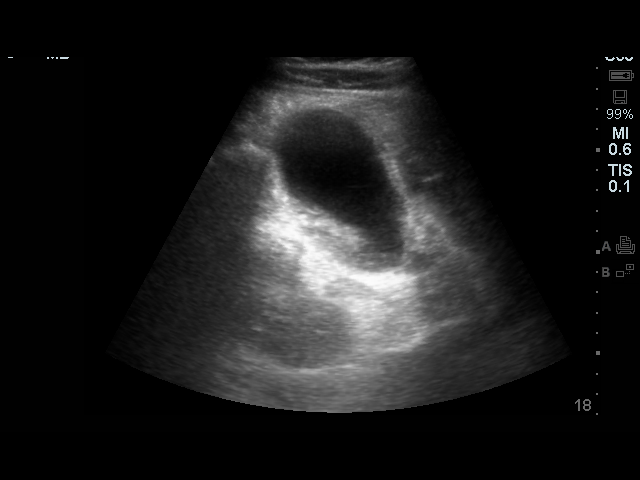
[im 2/2]
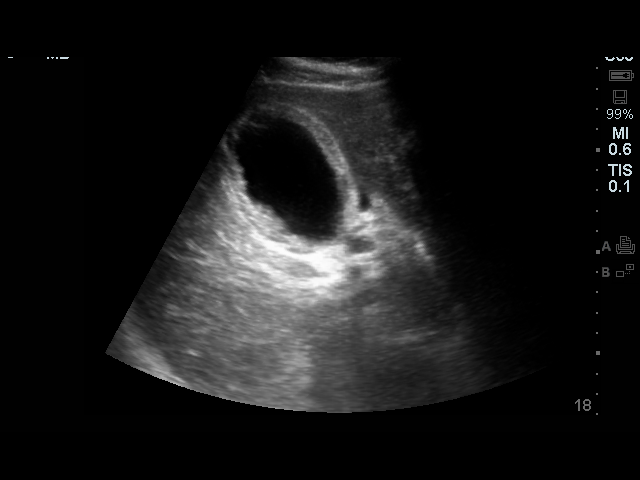

[2 of 2 positions shown; findings below may reference images not displayed]

EXAM:
CHOLECYSTOSTOMY

MEDICATIONS:
Patient is an inpatient and currently receiving scheduled
intravenous antibiotics for acute cholecystitis. No additional
antibiotic prophylaxis administered.

Additionally, 30 mg Toradol was administered intravenously at the
end of the procedure for pain control.

ANESTHESIA/SEDATION:
Moderate (conscious) sedation was employed during this procedure. A
total of Versed 3 mg, 1 mg Dilaudid and Fentanyl 150 mcg was
administered intravenously.

Moderate Sedation Time: 30 minutes. The patient's level of
consciousness and vital signs were monitored continuously by
radiology nursing throughout the procedure under my direct
supervision.

FLUOROSCOPY TIME:  Fluoroscopy Time: 1 minutes 0 seconds (2 mGy).

COMPLICATIONS:
None immediate.

Estimated blood loss:  0

PROCEDURE:
Informed written consent was obtained from the patient after a
thorough discussion of the procedural risks, benefits and
alternatives. All questions were addressed. Maximal Sterile Barrier
Technique was utilized including caps, mask, sterile gowns, sterile
gloves, sterile drape, hand hygiene and skin antiseptic. A timeout
was performed prior to the initiation of the procedure.

Ultrasound was used to interrogate the right upper quadrant. The
gallbladder is diffusely distended and edematous. Local anesthesia
was attained by infiltration with 1% lidocaine. A small dermatotomy
was made. Under real-time sonographic guidance, a 21 gauge Accustick
needle was advanced through a short transhepatic course and into the
gallbladder lumen. There was return of a bile through the needle. A
gentle hand injection of contrast material opacifies the gallbladder
lumen.

The needle was removed. The Accustick sheath was advanced over the
wire. The initial micro wire was then exchanged for a 0.035 J-wire.
The tract was then serially dilated to 10 French and Bhargavi Ranieri
French all-purpose drainage catheter was advanced over the wire and
into the gallbladder. Approximately 100 mL thick, turbid bile was
aspirated. A sample was sent for culture. A gentle hand injection of
contrast material through the tube under fluoroscopy confirmed tube
placement within the gallbladder lumen. An image was saved and
stored.

The tube was then flushed and secured to the skin with 0 Prolene
suture and a sterile bandage. The tube was hooked to gravity bag
drainage. The patient experienced considerable pain during the
procedure necessitating a relatively high sedation dose.
IMPRESSION: Technically successful transhepatic percutaneous cholecystostomy
tube placement for acute calculus cholecystitis.

A culture of aspirated bile was sent.
